# Patient Record
Sex: Female | Born: 1940 | Race: White | Hispanic: No | Smoking: Former smoker
Health system: Southern US, Community
[De-identification: ages and names within clinical notes are randomized; demographics above are authoritative.]

## PROBLEM LIST (undated history)

## (undated) DIAGNOSIS — Z9109 Other allergy status, other than to drugs and biological substances: Secondary | ICD-10-CM

## (undated) DIAGNOSIS — M1990 Unspecified osteoarthritis, unspecified site: Secondary | ICD-10-CM

## (undated) DIAGNOSIS — F329 Major depressive disorder, single episode, unspecified: Secondary | ICD-10-CM

## (undated) DIAGNOSIS — Z860100 Personal history of colon polyps, unspecified: Secondary | ICD-10-CM

## (undated) DIAGNOSIS — K449 Diaphragmatic hernia without obstruction or gangrene: Secondary | ICD-10-CM

## (undated) DIAGNOSIS — E039 Hypothyroidism, unspecified: Secondary | ICD-10-CM

## (undated) DIAGNOSIS — Z8711 Personal history of peptic ulcer disease: Secondary | ICD-10-CM

## (undated) DIAGNOSIS — R251 Tremor, unspecified: Secondary | ICD-10-CM

## (undated) DIAGNOSIS — M797 Fibromyalgia: Secondary | ICD-10-CM

## (undated) DIAGNOSIS — Z8719 Personal history of other diseases of the digestive system: Secondary | ICD-10-CM

## (undated) DIAGNOSIS — K219 Gastro-esophageal reflux disease without esophagitis: Secondary | ICD-10-CM

## (undated) DIAGNOSIS — F32A Depression, unspecified: Secondary | ICD-10-CM

## (undated) DIAGNOSIS — I10 Essential (primary) hypertension: Secondary | ICD-10-CM

## (undated) DIAGNOSIS — Z8619 Personal history of other infectious and parasitic diseases: Secondary | ICD-10-CM

## (undated) DIAGNOSIS — E785 Hyperlipidemia, unspecified: Secondary | ICD-10-CM

## (undated) HISTORY — PX: CHOLECYSTECTOMY: SHX55

## (undated) HISTORY — PX: TONSILLECTOMY: SUR1361

## (undated) HISTORY — PX: ABDOMINAL HYSTERECTOMY: SHX81

## (undated) HISTORY — PX: LUMBAR SPINE SURGERY: SHX701

## (undated) HISTORY — DX: Essential (primary) hypertension: I10

## (undated) HISTORY — DX: Personal history of other infectious and parasitic diseases: Z86.19

## (undated) HISTORY — DX: Hyperlipidemia, unspecified: E78.5

## (undated) HISTORY — DX: Unspecified osteoarthritis, unspecified site: M19.90

## (undated) HISTORY — DX: Fibromyalgia: M79.7

## (undated) HISTORY — DX: Personal history of colonic polyps: Z86.010

## (undated) HISTORY — DX: Gastro-esophageal reflux disease without esophagitis: K21.9

## (undated) HISTORY — DX: Personal history of colon polyps, unspecified: Z86.0100

## (undated) HISTORY — PX: INCONTINENCE SURGERY: SHX676

## (undated) HISTORY — DX: Diaphragmatic hernia without obstruction or gangrene: K44.9

## (undated) HISTORY — DX: Tremor, unspecified: R25.1

## (undated) HISTORY — DX: Personal history of other diseases of the digestive system: Z87.19

## (undated) HISTORY — DX: Depression, unspecified: F32.A

## (undated) HISTORY — PX: UPPER GASTROINTESTINAL ENDOSCOPY: SHX188

## (undated) HISTORY — PX: COLONOSCOPY: SHX174

## (undated) HISTORY — DX: Personal history of peptic ulcer disease: Z87.11

## (undated) HISTORY — DX: Other allergy status, other than to drugs and biological substances: Z91.09

## (undated) HISTORY — DX: Hypothyroidism, unspecified: E03.9

## (undated) HISTORY — DX: Major depressive disorder, single episode, unspecified: F32.9

## (undated) HISTORY — PX: CATARACT EXTRACTION: SUR2

---

## 2009-05-28 LAB — HM COLONOSCOPY

## 2013-04-03 ENCOUNTER — Encounter: Payer: Self-pay | Admitting: Family Medicine

## 2013-09-03 LAB — HM DEXA SCAN: HM DEXA SCAN: NORMAL

## 2014-10-02 LAB — HM MAMMOGRAPHY: HM MAMMO: NORMAL (ref 0–4)

## 2016-01-25 ENCOUNTER — Ambulatory Visit: Payer: Self-pay | Admitting: Family Medicine

## 2016-01-26 ENCOUNTER — Encounter: Payer: Self-pay | Admitting: Family Medicine

## 2016-01-26 ENCOUNTER — Ambulatory Visit (INDEPENDENT_AMBULATORY_CARE_PROVIDER_SITE_OTHER): Payer: Medicare Other | Admitting: Family Medicine

## 2016-01-26 VITALS — BP 120/60 | HR 78 | Temp 98.0°F | Ht 64.5 in | Wt 127.0 lb

## 2016-01-26 DIAGNOSIS — M797 Fibromyalgia: Secondary | ICD-10-CM | POA: Diagnosis not present

## 2016-01-26 DIAGNOSIS — E039 Hypothyroidism, unspecified: Secondary | ICD-10-CM

## 2016-01-26 DIAGNOSIS — M1990 Unspecified osteoarthritis, unspecified site: Secondary | ICD-10-CM

## 2016-01-26 DIAGNOSIS — R251 Tremor, unspecified: Secondary | ICD-10-CM | POA: Diagnosis not present

## 2016-01-26 DIAGNOSIS — I10 Essential (primary) hypertension: Secondary | ICD-10-CM | POA: Diagnosis not present

## 2016-01-26 DIAGNOSIS — Z9109 Other allergy status, other than to drugs and biological substances: Secondary | ICD-10-CM

## 2016-01-26 DIAGNOSIS — Z86010 Personal history of colonic polyps: Secondary | ICD-10-CM

## 2016-01-26 DIAGNOSIS — K449 Diaphragmatic hernia without obstruction or gangrene: Secondary | ICD-10-CM

## 2016-01-26 DIAGNOSIS — Z8719 Personal history of other diseases of the digestive system: Secondary | ICD-10-CM

## 2016-01-26 DIAGNOSIS — E785 Hyperlipidemia, unspecified: Secondary | ICD-10-CM

## 2016-01-26 DIAGNOSIS — Z8711 Personal history of peptic ulcer disease: Secondary | ICD-10-CM

## 2016-01-26 DIAGNOSIS — Z8619 Personal history of other infectious and parasitic diseases: Secondary | ICD-10-CM

## 2016-01-26 DIAGNOSIS — F329 Major depressive disorder, single episode, unspecified: Secondary | ICD-10-CM

## 2016-01-26 DIAGNOSIS — F32A Depression, unspecified: Secondary | ICD-10-CM

## 2016-01-26 NOTE — Patient Instructions (Signed)
Take care.  Glad to see you.  We'll work on getting your records and then we'll set up some labs.  We'll go from there.  Update me as needed.

## 2016-01-26 NOTE — Progress Notes (Signed)
Pre visit review using our clinic review tool, if applicable. No additional management support is needed unless otherwise documented below in the visit note. 

## 2016-01-28 ENCOUNTER — Encounter: Payer: Self-pay | Admitting: Family Medicine

## 2016-01-28 DIAGNOSIS — Z86010 Personal history of colonic polyps: Secondary | ICD-10-CM | POA: Insufficient documentation

## 2016-01-28 DIAGNOSIS — Z8619 Personal history of other infectious and parasitic diseases: Secondary | ICD-10-CM | POA: Insufficient documentation

## 2016-01-28 DIAGNOSIS — M1990 Unspecified osteoarthritis, unspecified site: Secondary | ICD-10-CM | POA: Insufficient documentation

## 2016-01-28 DIAGNOSIS — E039 Hypothyroidism, unspecified: Secondary | ICD-10-CM | POA: Insufficient documentation

## 2016-01-28 DIAGNOSIS — Z8711 Personal history of peptic ulcer disease: Secondary | ICD-10-CM | POA: Insufficient documentation

## 2016-01-28 DIAGNOSIS — R251 Tremor, unspecified: Secondary | ICD-10-CM | POA: Insufficient documentation

## 2016-01-28 DIAGNOSIS — E785 Hyperlipidemia, unspecified: Secondary | ICD-10-CM | POA: Insufficient documentation

## 2016-01-28 DIAGNOSIS — I10 Essential (primary) hypertension: Secondary | ICD-10-CM | POA: Insufficient documentation

## 2016-01-28 DIAGNOSIS — F32A Depression, unspecified: Secondary | ICD-10-CM | POA: Insufficient documentation

## 2016-01-28 DIAGNOSIS — Z9109 Other allergy status, other than to drugs and biological substances: Secondary | ICD-10-CM | POA: Insufficient documentation

## 2016-01-28 DIAGNOSIS — Z8719 Personal history of other diseases of the digestive system: Secondary | ICD-10-CM | POA: Insufficient documentation

## 2016-01-28 DIAGNOSIS — F329 Major depressive disorder, single episode, unspecified: Secondary | ICD-10-CM | POA: Insufficient documentation

## 2016-01-28 DIAGNOSIS — K449 Diaphragmatic hernia without obstruction or gangrene: Secondary | ICD-10-CM | POA: Insufficient documentation

## 2016-01-28 DIAGNOSIS — M797 Fibromyalgia: Secondary | ICD-10-CM | POA: Insufficient documentation

## 2016-01-28 NOTE — Assessment & Plan Note (Signed)
No change in meds.  No ADE on med.  Continue as is.  Requesting records.  >30 minutes spent in face to face time with patient, >50% spent in counselling or coordination of care.

## 2016-01-28 NOTE — Progress Notes (Signed)
New patient.  Fibromyalgia with diffuse aches.  Going on for years . Requesting records.  No ADE on meds.   HTN.  No ADE on med.  No CP, not SOB.  Compliant.  Will be due for labs.  Requesting records.   Hypothyroidism.  No ADE on med. Compliant.  Will be due for labs.  Requesting records.   Tremor.  Longstanding.  Prev neuro eval, thought not to have parkinsons per patient report.  Requesting records.   Requesting records re: prev vaccinations and health maintenance. Vaccines up to date per patient report.   PMH and SH reviewed  ROS: Per HPI unless specifically indicated in ROS section   Meds, vitals, and allergies reviewed.   GEN: nad, alert and oriented HEENT: mucous membranes moist NECK: supple w/o LA, no tmg CV: rrr.  PULM: ctab, no inc wob ABD: soft, +bs EXT: no edema SKIN: no acute rash, tremor noted in B hands.

## 2016-01-28 NOTE — Assessment & Plan Note (Signed)
Requesting records.  At baseline per patient.

## 2016-01-28 NOTE — Assessment & Plan Note (Signed)
No change in meds.  No ADE on med.  Continue as is.  Requesting records.  

## 2016-01-28 NOTE — Assessment & Plan Note (Signed)
No change in meds.  No ADE on med.  Continue as is.  Requesting records.

## 2016-03-04 ENCOUNTER — Telehealth: Payer: Self-pay | Admitting: Family Medicine

## 2016-03-04 NOTE — Telephone Encounter (Signed)
Did Dr.Duncan receive records from Dr.McClelland? Please call patient back to let her know.

## 2016-03-06 ENCOUNTER — Encounter: Payer: Self-pay | Admitting: Family Medicine

## 2016-03-06 NOTE — Telephone Encounter (Signed)
I haven't gotten anything yet.  I held onto the record request form after it was faxed previously, as I was awaiting records.  Please send again.  I was going to ask about this if we didn't get records soon.    Fax number is [phone].   Thanks.

## 2016-03-07 NOTE — Telephone Encounter (Signed)
Please send form to Ricki MillerLinda Thomas to see if the front office can work on getingt these records.

## 2016-03-07 NOTE — Telephone Encounter (Signed)
It's on your desk.  Thanks.

## 2016-03-07 NOTE — Telephone Encounter (Signed)
Form up front as Ricki MillerLinda Thomas instructed.

## 2016-03-17 ENCOUNTER — Ambulatory Visit (INDEPENDENT_AMBULATORY_CARE_PROVIDER_SITE_OTHER): Payer: Medicare Other

## 2016-03-17 ENCOUNTER — Other Ambulatory Visit: Payer: Self-pay | Admitting: Family Medicine

## 2016-03-17 ENCOUNTER — Encounter (INDEPENDENT_AMBULATORY_CARE_PROVIDER_SITE_OTHER): Payer: Self-pay

## 2016-03-17 VITALS — BP 120/80 | HR 67 | Temp 97.5°F | Ht 63.5 in | Wt 131.0 lb

## 2016-03-17 DIAGNOSIS — I10 Essential (primary) hypertension: Secondary | ICD-10-CM

## 2016-03-17 DIAGNOSIS — Z0000 Encounter for general adult medical examination without abnormal findings: Secondary | ICD-10-CM

## 2016-03-17 LAB — COMPREHENSIVE METABOLIC PANEL
ALBUMIN: 4.4 g/dL (ref 3.5–5.2)
ALK PHOS: 66 U/L (ref 39–117)
ALT: 22 U/L (ref 0–35)
AST: 15 U/L (ref 0–37)
BUN: 13 mg/dL (ref 6–23)
CALCIUM: 9.5 mg/dL (ref 8.4–10.5)
CHLORIDE: 102 meq/L (ref 96–112)
CO2: 30 mEq/L (ref 19–32)
Creatinine, Ser: 0.84 mg/dL (ref 0.40–1.20)
GFR: 69.83 mL/min (ref >60.00)
Glucose, Bld: 99 mg/dL (ref 70–99)
POTASSIUM: 4 meq/L (ref 3.5–5.1)
SODIUM: 137 meq/L (ref 135–145)
TOTAL PROTEIN: 7.2 g/dL (ref 6.0–8.3)
Total Bilirubin: 0.8 mg/dL (ref 0.2–1.2)

## 2016-03-17 LAB — CBC WITH DIFFERENTIAL/PLATELET
Basophils Absolute: 0 10*3/uL (ref 0.0–0.1)
Basophils Relative: 0.8 % (ref 0.0–3.0)
EOS ABS: 0.1 10*3/uL (ref 0.0–0.7)
EOS PCT: 2.5 % (ref 0.0–5.0)
HEMATOCRIT: 37 % (ref 36.0–46.0)
HEMOGLOBIN: 12.5 g/dL (ref 12.0–15.0)
LYMPHS PCT: 28.3 % (ref 12.0–46.0)
Lymphs Abs: 1.6 10*3/uL (ref 0.7–4.0)
MCHC: 33.7 g/dL (ref 30.0–36.0)
MCV: 89.9 fl (ref 78.0–100.0)
MONO ABS: 0.3 10*3/uL (ref 0.1–1.0)
Monocytes Relative: 5.7 % (ref 3.0–12.0)
Neutro Abs: 3.6 10*3/uL (ref 1.4–7.7)
Neutrophils Relative %: 62.7 % (ref 43.0–77.0)
Platelets: 238 10*3/uL (ref 150.0–400.0)
RBC: 4.11 Mil/uL (ref 3.87–5.11)
RDW: 12.6 % (ref 11.5–15.5)
WBC: 5.7 10*3/uL (ref 4.0–10.5)

## 2016-03-17 LAB — LIPID PANEL
CHOLESTEROL: 218 mg/dL — AB (ref 0–200)
HDL: 59.4 mg/dL (ref >39.00)
LDL CALC: 138 mg/dL — AB (ref 0–99)
NonHDL: 158.34
TRIGLYCERIDES: 101 mg/dL (ref 0.0–149.0)
Total CHOL/HDL Ratio: 4
VLDL: 20.2 mg/dL (ref 0.0–40.0)

## 2016-03-17 LAB — TSH: TSH: 0.93 u[IU]/mL (ref 0.35–4.50)

## 2016-03-17 NOTE — Patient Instructions (Signed)
[name] , Thank you for taking time to come for your Medicare Wellness Visit. I appreciate your ongoing commitment to your health goals. Please review the following plan we discussed and let me know if I can assist you in the future.   These are the goals we discussed: Goals    . safety          Starting 03/17/16, I will continue to use cane as needed to prevent accidental falls.        This is a list of the screening recommended for you and due dates:  Health Maintenance  Topic Date Due  . Shingles Vaccine  03/17/2026*  . Tetanus Vaccine  03/17/2026*  . Pneumonia vaccines (2 of 2 - PPSV23) 11/14/2016  . Flu Shot  Addressed  . DEXA scan (bone density measurement)  Completed  *Topic was postponed. The date shown is not the original due date.   Preventive Care for Adults  A healthy lifestyle and preventive care can promote health and wellness. Preventive health guidelines for adults include the following key practices.  . A routine yearly physical is a good way to check with your health care provider about your health and preventive screening. It is a chance to share any concerns and updates on your health and to receive a thorough exam.  . Visit your dentist for a routine exam and preventive care every 6 months. Brush your teeth twice a day and floss once a day. Good oral hygiene prevents tooth decay and gum disease.  . The frequency of eye exams is based on your age, health, family medical history, use  of contact lenses, and other factors. Follow your health care provider's ecommendations for frequency of eye exams.  . Eat a healthy diet. Foods like vegetables, fruits, whole grains, low-fat dairy products, and lean protein foods contain the nutrients you need without too many calories. Decrease your intake of foods high in solid fats, added sugars, and salt. Eat the right amount of calories for you. Get information about a proper diet from your health care provider, if  necessary.  . Regular physical exercise is one of the most important things you can do for your health. Most adults should get at least 150 minutes of moderate-intensity exercise (any activity that increases your heart rate and causes you to sweat) each week. In addition, most adults need muscle-strengthening exercises on 2 or more days a week.  Silver Sneakers may be a benefit available to you. To determine eligibility, you may visit the website: www.silversneakers.com or contact program at [phone] Mon-Fri between 8AM-8PM.   . Maintain a healthy weight. The body mass index (BMI) is a screening tool to identify possible weight problems. It provides an estimate of body fat based on height and weight. Your health care provider can find your BMI and can help you achieve or maintain a healthy weight.   For adults 20 years and older: ? A BMI below 18.5 is considered underweight. ? A BMI of 18.5 to 24.9 is normal. ? A BMI of 25 to 29.9 is considered overweight. ? A BMI of 30 and above is considered obese.   . Maintain normal blood lipids and cholesterol levels by exercising and minimizing your intake of saturated fat. Eat a balanced diet with plenty of fruit and vegetables. Blood tests for lipids and cholesterol should begin at age 78 and be repeated every 5 years. If your lipid or cholesterol levels are high, you are over 50, or you  are at high risk for heart disease, you may need your cholesterol levels checked more frequently. Ongoing high lipid and cholesterol levels should be treated with medicines if diet and exercise are not working.  . If you smoke, find out from your health care provider how to quit. If you do not use tobacco, please do not start.  . If you choose to drink alcohol, please do not consume more than 2 drinks per day. One drink is considered to be 12 ounces (355 mL) of beer, 5 ounces (148 mL) of wine, or 1.5 ounces (44 mL) of liquor.  . If you are 66-51 years old, ask your  health care provider if you should take aspirin to prevent strokes.  . Use sunscreen. Apply sunscreen liberally and repeatedly throughout the day. You should seek shade when your shadow is shorter than you. Protect yourself by wearing long sleeves, pants, a wide-brimmed hat, and sunglasses year round, whenever you are outdoors.  . Once a month, do a whole body skin exam, using a mirror to look at the skin on your back. Tell your health care provider of new moles, moles that have irregular borders, moles that are larger than a pencil eraser, or moles that have changed in shape or color.

## 2016-03-17 NOTE — Progress Notes (Signed)
Pre visit review using our clinic review tool, if applicable. No additional management support is needed unless otherwise documented below in the visit note. 

## 2016-03-17 NOTE — Progress Notes (Signed)
Subjective:   Kelsey Christian is a 76 y.o. female who presents for an Initial Medicare Annual Wellness Visit.  Review of Systems    N/A  Cardiac Risk Factors include: advanced age (>71men, >56 women);dyslipidemia;hypertension     Objective:    Today's Vitals   03/17/16 1133  BP: 120/80  Pulse: 67  Temp: 97.5 F (36.4 C)  TempSrc: Oral  SpO2: 98%  Weight: 131 lb (59.4 kg)  Height: 5' 3.5" (1.613 m)  PainSc: 5   PainLoc: Generalized   Body mass index is 22.84 kg/m.   Current Medications (verified) Outpatient Encounter Prescriptions as of 03/17/2016  Medication Sig  . carboxymethylcellulose 1 % ophthalmic solution 1 drop 3 (three) times daily.  . diphenhydrAMINE (BENADRYL) 25 MG tablet Take 25 mg by mouth every 6 (six) hours as needed.  . fluticasone (FLONASE) 50 MCG/ACT nasal spray Place 2 sprays into both nostrils daily.  . hypromellose (GENTEAL SEVERE) 0.3 % GEL ophthalmic ointment Place into both eyes once.  Marland Kitchen levothyroxine (SYNTHROID, LEVOTHROID) 112 MCG tablet Take 112 mcg by mouth daily before breakfast.  . loratadine (CLARITIN) 10 MG tablet Take 10 mg by mouth daily.  Marland Kitchen LORazepam (ATIVAN) 0.5 MG tablet Take 0.5 mg by mouth every 8 (eight) hours.  Marland Kitchen losartan (COZAAR) 100 MG tablet Take 100 mg by mouth daily.  Marland Kitchen omeprazole (PRILOSEC) 20 MG capsule Take 20 mg by mouth daily.  . polyvinyl alcohol-povidone (HYPOTEARS) 1.4-0.6 % ophthalmic solution Place 1-2 drops into both eyes as needed.  . Psyllium (RA FIBER SUPPLEMENT PO) Take by mouth daily.  . traMADol (ULTRAM) 50 MG tablet Take by mouth 3 (three) times daily as needed.  . traZODone (DESYREL) 50 MG tablet Take 50 mg by mouth at bedtime.  . verapamil (CALAN-SR) 180 MG CR tablet Take 180 mg by mouth 2 (two) times daily.   No facility-administered encounter medications on file as of 03/17/2016.     Allergies (verified) Penicillins   History: Past Medical History:  Diagnosis Date  . Arthritis   . Depression     . Environmental allergies   . Fibromyalgia   . GERD (gastroesophageal reflux disease)   . Hiatal hernia   . History of brucellosis   . History of colon polyps   . History of stomach ulcers   . HLD (hyperlipidemia)   . HTN (hypertension)   . Hypothyroidism   . Tremor    Past Surgical History:  Procedure Laterality Date  . ABDOMINAL HYSTERECTOMY    . CATARACT EXTRACTION Bilateral   . CHOLECYSTECTOMY    . INCONTINENCE SURGERY    . LUMBAR SPINE SURGERY    . TONSILLECTOMY     Family History  Problem Relation Age of Onset  . Stroke Mother   . Heart disease Father    Social History   Occupational History  . Not on file.   Social History Main Topics  . Smoking status: Former Games developer  . Smokeless tobacco: Never Used  . Alcohol use No  . Drug use: No  . Sexual activity: Not on file    Tobacco Counseling Counseling given: No   Activities of Daily Living In your present state of health, do you have any difficulty performing the following activities: 03/17/2016  Hearing? N  Vision? N  Difficulty concentrating or making decisions? Y  Walking or climbing stairs? Y  Dressing or bathing? N  Doing errands, shopping? Y  Preparing Food and eating ? Y  Using the Toilet? N  In  the past six months, have you accidently leaked urine? Y  Do you have problems with loss of bowel control? N  Managing your Medications? N  Managing your Finances? N  Housekeeping or managing your Housekeeping? Y  Some recent data might be hidden    Immunizations and Health Maintenance Immunization History  Administered Date(s) Administered  . Influenza-Unspecified 11/15/2015  . Pneumococcal Conjugate-13 11/15/2015   There are no preventive care reminders to display for this patient.  Patient Care Team: Joaquim NamGraham S Duncan, MD as PCP - General (Family Medicine)     Assessment:   This is a routine wellness examination for Kelsey Christian.   Hearing/Vision screen  Hearing Screening   125Hz  250Hz  500Hz   1000Hz  2000Hz  3000Hz  4000Hz  6000Hz  8000Hz   Right ear:   40 0 40  40    Left ear:   0 0 40  40      Visual Acuity Screening   Right eye Left eye Both eyes  Without correction:     With correction: 20/20 20/20 20/20     Dietary issues and exercise activities discussed: Current Exercise Habits: The patient does not participate in regular exercise at present, Exercise limited by: neurologic condition(s);orthopedic condition(s)  Goals    . safety          Starting 03/17/16, I will continue to use cane as needed to prevent accidental falls.       Depression Screen PHQ 2/9 Scores 03/17/2016  PHQ - 2 Score 0    Fall Risk Fall Risk  03/17/2016  Falls in the past year? No    Cognitive Function: MMSE - Mini Mental State Exam 03/17/2016  Orientation to time 5  Orientation to Place 5  Registration 3  Attention/ Calculation 0  Recall 1  Recall-comments pt was unable to recall 2 of 3 words  Language- name 2 objects 0  Language- repeat 1  Language- follow 3 step command 3  Language- read & follow direction 0  Write a sentence 0  Copy design 0  Total score 18     PLEASE NOTE: A Mini-Cog screen was completed. Maximum score is 20. A value of 0 denotes this part of Folstein MMSE was not completed or the patient failed this part of the Mini-Cog screening.   Mini-Cog Screening Orientation to Time - Max 5 pts Orientation to Place - Max 5 pts Registration - Max 3 pts Recall - Max 3 pts Language Repeat - Max 1 pts Language Follow 3 Step Command - Max 3 pts    Screening Tests Health Maintenance  Topic Date Due  . ZOSTAVAX  03/17/2026 (Originally 12/18/1998)  . TETANUS/TDAP  03/17/2026 (Originally 12/17/1959)  . PNA vac Low Risk Adult (2 of 2 - PPSV23) 11/14/2016  . INFLUENZA VACCINE  Addressed  . DEXA SCAN  Completed      Plan:     I have personally reviewed and addressed the Medicare Annual Wellness questionnaire and have noted the following in the patient's chart:  A. Medical and  social history B. Use of alcohol, tobacco or illicit drugs  C. Current medications and supplements D. Functional ability and status E.  Nutritional status F.  Physical activity G. Advance directives H. List of other physicians I.  Hospitalizations, surgeries, and ER visits in previous 12 months J.  Vitals K. Screenings to include hearing, vision, cognitive, depression L. Referrals and appointments - none  In addition, I have reviewed and discussed with patient certain preventive protocols, quality metrics, and best practice recommendations.  A written personalized care plan for preventive services as well as general preventive health recommendations were provided to patient.  See attached scanned questionnaire for additional information.   Signed,   Lindell Noe, MHA, BS, LPN Health Coach

## 2016-03-17 NOTE — Progress Notes (Signed)
PCP notes:   Health maintenance:  Flu vaccine - per pt, administered in Oct 2017 PCV13 - per pt, administered in Oct 2017 Tetanus - postponed/insurance Shingles vaccine - postponed/insurance  Abnormal screenings:   Hearing - failed Mini-Cog score: 18/20  Patient concerns:   None  Nurse concerns:  None  Next PCP appt:   03/21/16 @ 1415  I reviewed health advisor's note, was available for consultation on the day of service listed in this note, and agree with documentation and plan. Crawford GivensGraham Duncan, MD.

## 2016-03-21 ENCOUNTER — Encounter: Payer: Self-pay | Admitting: Family Medicine

## 2016-03-21 ENCOUNTER — Ambulatory Visit (INDEPENDENT_AMBULATORY_CARE_PROVIDER_SITE_OTHER): Payer: Medicare Other | Admitting: Family Medicine

## 2016-03-21 VITALS — BP 130/70 | HR 75 | Temp 97.4°F | Ht 63.5 in | Wt 126.5 lb

## 2016-03-21 DIAGNOSIS — Z0000 Encounter for general adult medical examination without abnormal findings: Secondary | ICD-10-CM

## 2016-03-21 DIAGNOSIS — I10 Essential (primary) hypertension: Secondary | ICD-10-CM

## 2016-03-21 DIAGNOSIS — R251 Tremor, unspecified: Secondary | ICD-10-CM | POA: Diagnosis not present

## 2016-03-21 DIAGNOSIS — M797 Fibromyalgia: Secondary | ICD-10-CM | POA: Diagnosis not present

## 2016-03-21 DIAGNOSIS — E785 Hyperlipidemia, unspecified: Secondary | ICD-10-CM

## 2016-03-21 DIAGNOSIS — Z86010 Personal history of colonic polyps: Secondary | ICD-10-CM

## 2016-03-21 DIAGNOSIS — R5383 Other fatigue: Secondary | ICD-10-CM

## 2016-03-21 DIAGNOSIS — Z8719 Personal history of other diseases of the digestive system: Secondary | ICD-10-CM | POA: Diagnosis not present

## 2016-03-21 DIAGNOSIS — R413 Other amnesia: Secondary | ICD-10-CM

## 2016-03-21 MED ORDER — DHEA 25 MG PO CAPS
25.0000 mg | ORAL_CAPSULE | Freq: Every day | ORAL | 1 refills | Status: AC
Start: 2016-03-21 — End: ?

## 2016-03-21 NOTE — Progress Notes (Signed)
Flu vaccine - per pt, administered in Oct 2017 PCV13 - per pt, administered in Oct 2017 Tetanus - postponed/insurance, d/w pt.  Shingles vaccine - postponed/insurance, d/w pt.   Abnormal screenings:  Hearing - failed.  Declined hearing aids.   Mini-Cog score: 18/20.   She notes some lapses in memory.   Rechecked today: She knows the year, month, day of the week.  Knows address.   Can do math.   Can read a watch.   2/3 recall today.   She isn't driving, but that was a long standing decision not memory related.   She came in today with a copy of her records recalling details of prev events.   We discussed that she may have some memory lapses, but did at this point does not appear to have an ominous loss of memory that puts her in a high-risk situation. We agreed to follow this clinically.  Fibromyalgia. Had avoided statins since she already had aches.  She is managing with daily pain in the meantime.  Intolerant of gabapentin with inc falls on med.    H/o Barrett's.  Discussed with patient about referral to GI for the consideration of EGD and cologuard versus colonoscopy. I would like GI input. If she could avoid a bowel prep for the colonoscopy and proceed with: Instead, this would be preferred by the patient.  In any event she needs referral to GI because of her history of Barrett's.  Fatigue. Previously treated with DHEA.  Has been on DHEA for years, 22.5mg  a day.  Discussed with patient about options. I did not want her to stop the medication cold [place]. I wrote a prescription for 25 mg. She can take that 6 days a week to get approximately the same dose and we can recheck a level later on. She had significant return of fatigue when she has stopped the medication previously.  Hypertension:    Using medication without problems or lightheadedness: yes Chest pain with exertion:no Edema:no Short of breath:no Labs d/w pt.   Tremor.  D/w pt.  Worse in the last few months.  D/w pt about  neuro referral.      Urinary frequency.  Not burning with urination.  D/w pt about Kegel's.    PMH and SH reviewed  ROS: Per HPI unless specifically indicated in ROS section   Meds, vitals, and allergies reviewed.   GEN: nad, alert and oriented HEENT: mucous membranes moist NECK: supple w/o LA CV: rrr.  no murmur PULM: ctab, no inc wob ABD: soft, +bs EXT: no edema SKIN: no acute rash B hand tremor noted.

## 2016-03-21 NOTE — Progress Notes (Signed)
Pre visit review using our clinic review tool, if applicable. No additional management support is needed unless otherwise documented below in the visit note. 

## 2016-03-21 NOTE — Patient Instructions (Addendum)
Kelsey Christian will call about your referral ([facility] and [place]  Recheck DHEA in about 3 months.  Take care.  Glad to see you.  Update me as needed.

## 2016-03-23 DIAGNOSIS — Z0000 Encounter for general adult medical examination without abnormal findings: Secondary | ICD-10-CM | POA: Insufficient documentation

## 2016-03-23 DIAGNOSIS — R413 Other amnesia: Secondary | ICD-10-CM | POA: Insufficient documentation

## 2016-03-23 DIAGNOSIS — R5383 Other fatigue: Secondary | ICD-10-CM | POA: Insufficient documentation

## 2016-03-23 NOTE — Assessment & Plan Note (Signed)
Refer to neurology

## 2016-03-23 NOTE — Assessment & Plan Note (Signed)
Blood pressure is reasonable. Continue as is. Labs discussed with patient.

## 2016-03-23 NOTE — Assessment & Plan Note (Signed)
See above, refer to GI for the consideration of EGD and cologuard versus colonoscopy. I would like GI input. If she could avoid a bowel prep for the colonoscopy and proceed with: Instead, this would be preferred by the patient.  In any event she needs referral to GI because of her history of Barrett's.

## 2016-03-23 NOTE — Assessment & Plan Note (Signed)
She had chronically been taking DHEA. She likely will not be able to get her previous dose of 22.5 mg capsules. I wrote a prescription for 25 mg capsules. She can take those 6 days a week. We can recheck a level later on the spring. We can go from there. She previously had profound fatigue returned when she had stopped the medication.

## 2016-03-23 NOTE — Assessment & Plan Note (Signed)
We tabled the issue of statin treatment at this point.

## 2016-03-23 NOTE — Assessment & Plan Note (Signed)
Flu vaccine - per pt, administered in Oct 2017 PCV13 - per pt, administered in Oct 2017 Tetanus - postponed/insurance, d/w pt.  Shingles vaccine - postponed/insurance, d/w pt.  Hearing - failed.  Declined hearing aids.

## 2016-03-23 NOTE — Assessment & Plan Note (Signed)
Continue current medications. Pain is manageable as is.

## 2016-03-23 NOTE — Assessment & Plan Note (Addendum)
Prev Mini-Cog score: 18/20.   She notes some lapses in memory.   Rechecked today: She knows the year, month, day of the week.  Knows address.   Can do math.   Can read a watch.   2/3 recall today.   She isn't driving, but that was a long standing decision not memory related.   She came in today with a copy of her records recalling details of prev events.   We discussed that she may have some memory lapses, but did at this point does not appear to have an ominous loss of memory that puts her in a high-risk situation. We agreed to follow this clinically.  >25 minutes spent in face to face time with patient, >50% spent in counselling or coordination of care.

## 2016-04-13 NOTE — Progress Notes (Signed)
Subjective:   Kelsey Christian was seen in consultation in the movement disorder clinic at the request of Crawford Givens, MD.  The evaluation is for tremor.  I asked her several times how long tremor had been going on and she never answered the question and kept going back to her pain and fibromyalgia. Tremor involves the hands and arms and "my walk isn't that good."  Tremor is most noticeable "before I take my medication because my lorazepam helps it."  She takes 1.5 tablets (0.5mg ) bid of ativan and "I should be taking two of them."   There is a family hx of tremor in her brother but "not like me."    Affected by caffeine:  No. (1-2 cups of coffee) Affected by alcohol:  Doesn't drink EtOH Affected by stress:  Yes.   Affected by fatigue:  Yes.   Spills soup if on spoon:  Yes.   Spills glass of liquid if full:  Yes.   Affects ADL's (tying shoes, brushing teeth, etc):  Yes.    Current/Previously tried tremor medications: no  Current medications that may exacerbate tremor:  no  Outside reports reviewed: historical medical records, office notes and referral letter/letters.  Allergies  Allergen Reactions  . Gabapentin Other (See Comments)    Gait instability, falls.    . Penicillins Swelling    Outpatient Encounter Prescriptions as of 04/15/2016  Medication Sig  . carboxymethylcellulose 1 % ophthalmic solution 1 drop 3 (three) times daily.  Marland Kitchen DHEA 25 MG CAPS Take 1 capsule (25 mg total) by mouth daily. Except on Sundays.  (take 6 days a week)  . diphenhydrAMINE (BENADRYL) 25 MG tablet Take 25 mg by mouth every 6 (six) hours as needed.  . fluticasone (FLONASE) 50 MCG/ACT nasal spray Place 2 sprays into both nostrils daily.  . hydrochlorothiazide (MICROZIDE) 12.5 MG capsule Take 12.5 mg by mouth daily.  . hypromellose (GENTEAL SEVERE) 0.3 % GEL ophthalmic ointment Place into both eyes once.  Marland Kitchen levothyroxine (SYNTHROID, LEVOTHROID) 112 MCG tablet Take 112 mcg by mouth daily before breakfast.   . loratadine (CLARITIN) 10 MG tablet Take 10 mg by mouth daily.  Marland Kitchen LORazepam (ATIVAN) 0.5 MG tablet Take 0.5 mg by mouth every 8 (eight) hours.  Marland Kitchen losartan (COZAAR) 100 MG tablet Take 100 mg by mouth daily.  Marland Kitchen omeprazole (PRILOSEC) 20 MG capsule Take 20 mg by mouth daily.  . polyvinyl alcohol-povidone (HYPOTEARS) 1.4-0.6 % ophthalmic solution Place 1-2 drops into both eyes as needed.  . Psyllium (RA FIBER SUPPLEMENT PO) Take by mouth daily.  . traMADol (ULTRAM) 50 MG tablet Take by mouth 3 (three) times daily as needed.  . traZODone (DESYREL) 50 MG tablet Take 50 mg by mouth at bedtime.  . verapamil (CALAN-SR) 180 MG CR tablet Take 180 mg by mouth 2 (two) times daily.  . [DISCONTINUED] irbesartan (AVAPRO) 150 MG tablet Take 150 mg by mouth daily.   No facility-administered encounter medications on file as of 04/15/2016.     Past Medical History:  Diagnosis Date  . Arthritis   . Depression   . Environmental allergies   . Fibromyalgia   . GERD (gastroesophageal reflux disease)   . Hiatal hernia   . History of brucellosis   . History of colon polyps   . History of stomach ulcers   . HLD (hyperlipidemia)   . HTN (hypertension)   . Hypothyroidism   . Tremor     Past Surgical History:  Procedure Laterality Date  .  ABDOMINAL HYSTERECTOMY    . CATARACT EXTRACTION Bilateral   . CHOLECYSTECTOMY    . INCONTINENCE SURGERY    . LUMBAR SPINE SURGERY    . TONSILLECTOMY      Social History   Social History  . Marital status: Widowed    Spouse name: N/A  . Number of children: N/A  . Years of education: N/A   Occupational History  . Not on file.   Social History Main Topics  . Smoking status: Former Smoker    Quit date: 04/15/1981  . Smokeless tobacco: Never Used  . Alcohol use No  . Drug use: No  . Sexual activity: Not on file   Other Topics Concern  . 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 Mother Deceased  . Father Deceased  . Sister Alive  . Child Alive  . Child Deceased    Review of Systems A complete 10 system ROS was obtained and was negative apart from what is mentioned.   Objective:   VITALS:   Vitals:   04/15/16 1252  BP: 122/76  Pulse: 76  Weight: 131 lb (59.4 kg)  Height: 5' 3.5" (1.613 m)   Gen:  Appears stated age and in NAD.  Has trouble staying focused HEENT:  Normocephalic, atraumatic. The mucous membranes are moist. The superficial temporal arteries are without ropiness or tenderness. Cardiovascular: Regular rate and rhythm. Lungs: Clear to auscultation bilaterally. Neck: There are no carotid bruits noted bilaterally.  NEUROLOGICAL:  Orientation:  The patient is alert and oriented x 3.  Recent and remote memory are intact.  Attention span and concentration are normal.  Able to name objects and repeat without trouble.  Fund of knowledge is appropriate Cranial nerves: There is good facial symmetry. The pupils are equal round and reactive to light bilaterally. Fundoscopic exam reveals clear disc margins bilaterally. Extraocular muscles are intact and visual fields are full to confrontational testing. Speech is fluent and clear. Soft palate rises symmetrically and there is no tongue deviation. Hearing is intact to conversational tone. Tone: Tone is good throughout. Sensation: Sensation is intact to light touch and pinprick throughout (facial, trunk, extremities). Vibration is intact at the bilateral big toe. There is no extinction with double simultaneous stimulation. There is no sensory dermatomal level identified. Coordination:  The patient has no dysdiadichokinesia or dysmetria. Motor: Strength is 5/5 in the bilateral upper and lower extremities.  Shoulder shrug is equal bilaterally.  There is no  pronator drift.  There are no fasciculations noted. DTR's: Deep tendon reflexes are 2/4 at the bilateral biceps, triceps, brachioradialis, patella and achilles.  Plantar responses are downgoing bilaterally. Gait and Station: The patient is able to arise out of the chair without her hands.  She walks slowly down the hall and her cane "drags" along the floor.    MOVEMENT EXAM: Tremor:  There is a tremor in the arms.  It is at rest and with intention.  The tremor at rest changes in quality with amplitude and frequency and is markedly less with distraction.  The postural tremor will match frequency when she is asked to tap out a beat.  Intermittent head tremor in "no" direction.     Assessment/Plan:  1.  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  I talked to her about those things and told her we could try primidone but not sure how much it would be helpful.  She doesn't want primidone and I agree.  Pt admits that stress is huge component.  Talked about counseling and role it plays in treatment.  I think that if stress under better control, tremor would be much better controlled as well.  Told her that I do not recommend benzo in treatment of tremor.  Much greater than 50% of this visit was spent in counseling and coordinating care.  Total face to face time:  45 min.  F/u prn.   CC:  Crawford Givens, MD

## 2016-04-15 ENCOUNTER — Ambulatory Visit (INDEPENDENT_AMBULATORY_CARE_PROVIDER_SITE_OTHER): Payer: Medicare Other | Admitting: Neurology

## 2016-04-15 ENCOUNTER — Encounter: Payer: Self-pay | Admitting: Neurology

## 2016-04-15 VITALS — BP 122/76 | HR 76 | Ht 63.5 in | Wt 131.0 lb

## 2016-04-15 DIAGNOSIS — G250 Essential tremor: Secondary | ICD-10-CM

## 2016-04-15 DIAGNOSIS — F444 Conversion disorder with motor symptom or deficit: Secondary | ICD-10-CM | POA: Diagnosis not present

## 2016-04-17 ENCOUNTER — Telehealth: Payer: Self-pay | Admitting: Family Medicine

## 2016-04-17 NOTE — Telephone Encounter (Signed)
Call pt.  I saw the notes from Dr. Arbutus Leasat about her tremor.  Per the notes, pt admits that stress is huge component of her sx.  Dr. Arbutus Leasat talked about counseling and role it plays in treatment.  Dr. Arbutus Leasat thinks that if stress under better control, tremor would be much better controlled as well.  Dr. Arbutus Leasat did not recommend benzo in treatment of tremor.   We need to make plans about tapering off BZD, setting up other treatment for anxiety/mood, ie other meds and/or counseling.  Let me know if she needs help getting counseling set up; o/w needs OV to discuss anxiety tx option.  Thanks. 30 min OV.

## 2016-04-18 NOTE — Telephone Encounter (Signed)
Left message on answering machine for patient to call back,

## 2016-04-18 NOTE — Telephone Encounter (Signed)
Patient notified as instructed by telephone and verbalized understanding. Patient stated that she does not want counseling. Patient stated that she will call back and schedule an appointment, but will have to make transportation arrangements first.

## 2016-04-18 NOTE — Telephone Encounter (Signed)
Noted. Thanks.

## 2016-04-19 ENCOUNTER — Encounter: Payer: Self-pay | Admitting: Family Medicine

## 2016-04-25 ENCOUNTER — Ambulatory Visit: Payer: Medicare Other | Admitting: Family Medicine

## 2016-04-26 ENCOUNTER — Other Ambulatory Visit: Payer: Self-pay | Admitting: Gastroenterology

## 2016-04-26 DIAGNOSIS — R1084 Generalized abdominal pain: Secondary | ICD-10-CM

## 2016-05-02 ENCOUNTER — Encounter: Payer: Self-pay | Admitting: Family Medicine

## 2016-05-02 ENCOUNTER — Ambulatory Visit (INDEPENDENT_AMBULATORY_CARE_PROVIDER_SITE_OTHER): Payer: Medicare Other | Admitting: Family Medicine

## 2016-05-02 DIAGNOSIS — R251 Tremor, unspecified: Secondary | ICD-10-CM | POA: Diagnosis not present

## 2016-05-02 MED ORDER — DULOXETINE HCL 30 MG PO CPEP: 30.0000 mg | ORAL_CAPSULE | Freq: Every day | ORAL | 3 refills | Status: DC

## 2016-05-02 NOTE — Patient Instructions (Signed)
Start taking cymbalta once a day.  After about 10 days, then start tapering down on the lorazepam.  You can take a half tabs instead of a whole tab of lorazepam and gradually taper down.  Update me as needed in the meantime.  I'd like to know how you are doing in about 1-2 weeks.  Take care.  Glad to see you.

## 2016-05-02 NOTE — Progress Notes (Signed)
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 This is a long-standing issue for patient. She saw Dr. Arbutus Leasat in the meantime.  Per Dr. 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  Dr. Don Perkingat's note was reviewed with patient at the office visit.  H/o depression and anxiety.  h/o electroshock therapy tx in the past, disclosed by patient today.  She was prev on paxil and prozac in the past w/o relief.    She continues to have urinary frequency.  No burning with urination.    She has had f/u with GI and had questions about endoscopy. I asked her to check with GI about those questions.  PMH and SH reviewed  ROS: Per HPI unless specifically indicated in ROS section   Meds, vitals, and allergies reviewed.   GEN: nad, alert and oriented HEENT: mucous membranes moist NECK: supple w/o LA CV: rrr.   PULM: ctab, no inc wob ABD: soft, +bs EXT: no edema Variable tremor noted in the head and hands.

## 2016-05-02 NOTE — Progress Notes (Signed)
Pre visit review using our clinic review tool, if applicable. No additional management support is needed unless otherwise documented below in the visit note. 

## 2016-05-03 ENCOUNTER — Encounter: Payer: Self-pay | Admitting: Family Medicine

## 2016-05-03 ENCOUNTER — Ambulatory Visit: Payer: Medicare Other

## 2016-05-03 NOTE — Assessment & Plan Note (Signed)
Discussed with patient about options. She has multiple reasons to try Cymbalta. It may help with fibromyalgia pain. It may help with her mood. If it helps with her mood, then that may help her tremor. Reasonable to try, especially since she is previously failed Paxil and Prozac per patient report. There is a very small risk of serotonin syndrome with concurrent tramadol use. Discussed with patient. If the Cymbalta helps her pain level significantly she may need less tramadol at baseline. At this point the likely benefit of the medication probably greatly outweighs the risk, so it is reasonable to proceed. All questions answered.  Will start taking cymbalta once a day.  After about 10 days, then start tapering down on the lorazepam.  She can take a half tabs instead of a whole tab of lorazepam and gradually taper down.  She'll update me as needed in the meantime.  I'd like to know how she is doing in about 1-2 weeks.   >25 minutes spent in face to face time with patient, >50% spent in counselling or coordination of care.

## 2016-05-16 ENCOUNTER — Ambulatory Visit
Admission: RE | Admit: 2016-05-16 | Discharge: 2016-05-16 | Disposition: A | Discharge status: Home / Self Care | Payer: Medicare Other | Source: Ambulatory Visit | Attending: Gastroenterology | Admitting: Gastroenterology

## 2016-05-16 DIAGNOSIS — R1084 Generalized abdominal pain: Secondary | ICD-10-CM | POA: Diagnosis not present

## 2016-05-16 DIAGNOSIS — I700 Atherosclerosis of aorta: Secondary | ICD-10-CM | POA: Diagnosis not present

## 2016-05-16 DIAGNOSIS — K5730 Diverticulosis of large intestine without perforation or abscess without bleeding: Secondary | ICD-10-CM | POA: Insufficient documentation

## 2016-05-16 DIAGNOSIS — R634 Abnormal weight loss: Secondary | ICD-10-CM | POA: Diagnosis not present

## 2016-05-16 LAB — POCT I-STAT CREATININE: CREATININE: 0.8 mg/dL (ref 0.44–1.00)

## 2016-05-16 MED ORDER — IOPAMIDOL (ISOVUE-300) INJECTION 61%
100.0000 mL | Freq: Once | INTRAVENOUS | Status: AC | PRN
  Administered 2016-05-16: 100 mL via INTRAVENOUS

## 2016-06-20 ENCOUNTER — Other Ambulatory Visit (INDEPENDENT_AMBULATORY_CARE_PROVIDER_SITE_OTHER): Payer: Medicare Other

## 2016-06-20 ENCOUNTER — Other Ambulatory Visit: Payer: Self-pay | Admitting: *Deleted

## 2016-06-20 DIAGNOSIS — R5383 Other fatigue: Secondary | ICD-10-CM

## 2016-06-20 DIAGNOSIS — F419 Anxiety disorder, unspecified: Secondary | ICD-10-CM

## 2016-06-20 MED ORDER — TRAZODONE HCL 50 MG PO TABS: 50.0000 mg | ORAL_TABLET | Freq: Every day | ORAL | 1 refills | Status: DC

## 2016-06-20 MED ORDER — TRAMADOL HCL 50 MG PO TABS: 50.0000 mg | ORAL_TABLET | Freq: Three times a day (TID) | ORAL | 1 refills | Status: DC | PRN

## 2016-06-20 NOTE — Telephone Encounter (Signed)
Pt requesting medication refills. Rx never prescribed by Dr Para Marchuncan. Last OV 04/2016-acute

## 2016-06-20 NOTE — Telephone Encounter (Signed)
Please call in Tramadol. Trazodone sent. She was going to taper down and stop lorazepam. Refill denied. Please get update on patient now that she has started Cymbalta. Thanks.

## 2016-06-21 NOTE — Telephone Encounter (Signed)
Medication phoned to pharmacy.  Patient says she was not able to take the Cymbalta because it made her "shake" worse.  Therefore, she did not taper the Lorazepam.  Please advise.

## 2016-06-22 MED ORDER — LORAZEPAM 0.5 MG PO TABS: 0.2500 mg | ORAL_TABLET | Freq: Three times a day (TID) | ORAL | 0 refills | Status: DC

## 2016-06-22 NOTE — Telephone Encounter (Signed)
Patient notified as instructed by telephone and verbalized understanding. Patient stated that the shakes started about two weeks ago and she stopped the Cymbalta about a week ago. Patient stated that when she shakes and takes the Lorazepam the shakes stop after about 30 minutes. Patient stated that she is willing to go for a psych appointment. Advised patient that one of the referral coordinators will be in touch with her to get this set up.

## 2016-06-22 NOTE — Telephone Encounter (Signed)
Thanks.  Shirlee LimerickMarion, see below.  Thanks.

## 2016-06-22 NOTE — Telephone Encounter (Signed)
If she had trouble on the cymbalta, then she should have stopped it.  If she had updated me previously, then we could have worked on this sooner rather than later.  Dr. Arbutus Leasat did not recommend benzo in treatment of tremor.  She'll need to taper off and/or she will need psych eval.   I am willing to rx short term for now to allow her to get psych f/u set.   I put in the referral.  Please call in the rx.  My plan is to slowly taper her rx for lorazepam o/w.   Thanks.

## 2016-06-25 LAB — DHEA: DHEA: 364 ng/dL (ref 102–1185)

## 2016-07-12 ENCOUNTER — Telehealth: Payer: Self-pay

## 2016-07-12 NOTE — Telephone Encounter (Signed)
I don't have other good options for her for the tremor.  Neuro thought that there was a psychogenic component and anxiety tx would help.  The best option is likely to f/u with psych.   Thanks.

## 2016-07-12 NOTE — Telephone Encounter (Signed)
Patient advised.  Patient says she has appt with psych but it is not until 2 months and she needs help before then.  Patient advised that this note will be sent but that Dr. Para Marchuncan will not be back until next week and she says she is fine with that.

## 2016-07-12 NOTE — Telephone Encounter (Signed)
Pt said the cymbalta does not help the shakes and pt wants cb from Dr Para Marchuncan when he returns; pt does not want to speak with another provider and does not want to schedule appt with Dr Para Marchuncan until gets cb from Dr Para Marchuncan next week. I offered to schedule appt or send note to another provider that is in the office this week but pt said no.

## 2016-07-13 NOTE — Telephone Encounter (Signed)
Have her call to get on the cancellation list in the meantime, if not already done.  I don't know of other good options for her in the meantime.  Thanks.

## 2016-07-14 NOTE — Telephone Encounter (Signed)
Patient advised.

## 2016-08-09 ENCOUNTER — Ambulatory Visit
Admission: RE | Admit: 2016-08-09 | Discharge: 2016-08-09 | Disposition: A | Discharge status: Home / Self Care | Payer: Medicare Other | Source: Ambulatory Visit | Attending: Gastroenterology | Admitting: Gastroenterology

## 2016-08-09 ENCOUNTER — Encounter: Payer: Self-pay | Admitting: *Deleted

## 2016-08-09 ENCOUNTER — Ambulatory Visit: Payer: Medicare Other | Admitting: Anesthesiology

## 2016-08-09 ENCOUNTER — Encounter
Admission: RE | Disposition: A | Discharge status: Home / Self Care | Payer: Self-pay | Source: Ambulatory Visit | Attending: Gastroenterology

## 2016-08-09 DIAGNOSIS — Z8719 Personal history of other diseases of the digestive system: Secondary | ICD-10-CM | POA: Diagnosis not present

## 2016-08-09 DIAGNOSIS — Z5309 Procedure and treatment not carried out because of other contraindication: Secondary | ICD-10-CM | POA: Diagnosis not present

## 2016-08-09 DIAGNOSIS — Z86010 Personal history of colonic polyps: Secondary | ICD-10-CM | POA: Diagnosis not present

## 2016-08-09 DIAGNOSIS — Z1211 Encounter for screening for malignant neoplasm of colon: Secondary | ICD-10-CM | POA: Diagnosis present

## 2016-08-09 DIAGNOSIS — M797 Fibromyalgia: Secondary | ICD-10-CM | POA: Diagnosis not present

## 2016-08-09 DIAGNOSIS — Z87891 Personal history of nicotine dependence: Secondary | ICD-10-CM | POA: Insufficient documentation

## 2016-08-09 DIAGNOSIS — E039 Hypothyroidism, unspecified: Secondary | ICD-10-CM | POA: Diagnosis not present

## 2016-08-09 DIAGNOSIS — Z79899 Other long term (current) drug therapy: Secondary | ICD-10-CM | POA: Insufficient documentation

## 2016-08-09 DIAGNOSIS — F329 Major depressive disorder, single episode, unspecified: Secondary | ICD-10-CM | POA: Diagnosis not present

## 2016-08-09 DIAGNOSIS — K219 Gastro-esophageal reflux disease without esophagitis: Secondary | ICD-10-CM | POA: Diagnosis not present

## 2016-08-09 DIAGNOSIS — I10 Essential (primary) hypertension: Secondary | ICD-10-CM | POA: Insufficient documentation

## 2016-08-09 SURGERY — ESOPHAGOGASTRODUODENOSCOPY (EGD) WITH PROPOFOL
Anesthesia: General

## 2016-08-09 MED ORDER — FENTANYL CITRATE (PF) 100 MCG/2ML IJ SOLN
INTRAMUSCULAR | Status: AC
  Filled 2016-08-09: qty 2

## 2016-08-09 MED ORDER — PROPOFOL 500 MG/50ML IV EMUL
INTRAVENOUS | Status: AC
  Filled 2016-08-09: qty 50

## 2016-08-09 MED ORDER — MIDAZOLAM HCL 2 MG/2ML IJ SOLN
INTRAMUSCULAR | Status: AC
  Filled 2016-08-09: qty 2

## 2016-08-09 MED ORDER — SODIUM CHLORIDE 0.9 % IV SOLN: INTRAVENOUS | Status: DC

## 2016-08-09 NOTE — Anesthesia Preprocedure Evaluation (Deleted)
Anesthesia Evaluation  Patient identified by MRN, date of birth, ID band Patient awake    Reviewed: Allergy & Precautions, H&P , NPO status , Patient's Chart, lab work & pertinent test results  History of Anesthesia Complications Negative for: history of anesthetic complications  Airway Mallampati: III  TM Distance: <3 FB Neck ROM: limited    Dental  (+) Chipped, Caps   Pulmonary neg shortness of breath, former smoker,           Cardiovascular Exercise Tolerance: Good hypertension, (-) angina(-) Past MI and (-) DOE      Neuro/Psych PSYCHIATRIC DISORDERS Depression  Neuromuscular disease    GI/Hepatic Neg liver ROS, hiatal hernia, GERD  Medicated and Controlled,  Endo/Other  Hypothyroidism   Renal/GU negative Renal ROS  negative genitourinary   Musculoskeletal  (+) Arthritis , Fibromyalgia -  Abdominal   Peds  Hematology negative hematology ROS (+)   Anesthesia Other Findings Past Medical History: No date: Arthritis No date: Depression No date: Environmental allergies No date: Fibromyalgia No date: Fibromyalgia No date: GERD (gastroesophageal reflux disease) No date: Hiatal hernia No date: History of brucellosis No date: History of colon polyps No date: History of stomach ulcers No date: HLD (hyperlipidemia) No date: HTN (hypertension) No date: Hypothyroidism No date: Tremor  Past Surgical History: No date: ABDOMINAL HYSTERECTOMY No date: CATARACT EXTRACTION Bilateral No date: CHOLECYSTECTOMY No date: COLONOSCOPY No date: INCONTINENCE SURGERY No date: LUMBAR SPINE SURGERY No date: TONSILLECTOMY No date: UPPER GASTROINTESTINAL ENDOSCOPY  BMI    Body Mass Index:  21.45 kg/m      Reproductive/Obstetrics negative OB ROS                             Anesthesia Physical Anesthesia Plan  ASA: III  Anesthesia Plan: General   Post-op Pain Management:    Induction:  Intravenous  PONV Risk Score and Plan: 3 and Ondansetron, Dexamethasone, Propofol and Midazolam  Airway Management Planned: Natural Airway and Nasal Cannula  Additional Equipment:   Intra-op Plan:   Post-operative Plan:   Informed Consent: I have reviewed the patients History and Physical, chart, labs and discussed the procedure including the risks, benefits and alternatives for the proposed anesthesia with the patient or authorized representative who has indicated his/her understanding and acceptance.   Dental Advisory Given  Plan Discussed with: Anesthesiologist, CRNA and Surgeon  Anesthesia Plan Comments: (Patient consented for risks of anesthesia including but not limited to:  - adverse reactions to medications - damage to teeth, lips or other oral mucosa - sore throat or hoarseness - Damage to heart, brain, lungs or loss of life  Patient voiced understanding.)        Anesthesia Quick Evaluation

## 2016-08-09 NOTE — H&P (Signed)
Patient presented today for EGD and colonoscopy in regards to personal history of Barrett's esophagus and colon polyps. She stated that she had only been able to get down about half of her prep. I decided to a rectal examination that showed stool in the rectal vault. As such we decided to cancel procedure today as I did not want to sedate her for just one of the procedures and we will arrange for her return after reprep.

## 2016-08-11 ENCOUNTER — Telehealth: Payer: Self-pay | Admitting: Family Medicine

## 2016-08-11 NOTE — Telephone Encounter (Signed)
-----   Message from Darliss RidgelAarti K Kapur, MD sent at 08/11/2016 10:37 AM EDT ----- I saw Ms. Leard and agree with tapering off Ativan, esp after reviewing Dr Don Perkingat's note. I am recommending she start an SSRI Celexa which she is resistant to. She is asking me for pain medication and believed I was the physician to prescribe pain medication. I let her know this is not my expertise.

## 2016-08-11 NOTE — Telephone Encounter (Signed)
Thank you for your input.  I appreciate your help.  GSD.

## 2016-09-12 ENCOUNTER — Encounter: Payer: Self-pay | Admitting: Family Medicine

## 2016-09-12 ENCOUNTER — Ambulatory Visit (INDEPENDENT_AMBULATORY_CARE_PROVIDER_SITE_OTHER): Payer: Medicare Other | Admitting: Family Medicine

## 2016-09-12 DIAGNOSIS — F329 Major depressive disorder, single episode, unspecified: Secondary | ICD-10-CM | POA: Diagnosis not present

## 2016-09-12 DIAGNOSIS — F32A Depression, unspecified: Secondary | ICD-10-CM

## 2016-09-12 MED ORDER — CITALOPRAM HYDROBROMIDE 10 MG PO TABS: 10.0000 mg | ORAL_TABLET | Freq: Every day | ORAL | Status: DC

## 2016-09-12 MED ORDER — TRAZODONE HCL 50 MG PO TABS: 25.0000 mg | ORAL_TABLET | Freq: Every day | ORAL | 1 refills | Status: DC

## 2016-09-12 NOTE — Progress Notes (Signed)
She slept better with trazodone.  D/w pt.  reasonable to continue.  Long-standing history of anxiety and depression. Previous had multiple forms of treatment. She continues to be anxious and depressed. She has noted social upheaval when she moved to the area. She has seen psychiatry recently. She started citalopram 10 mg a day. No noted effect from the medication but also no noted adverse effect. She discontinued medication in the meantime, likely prematurely. She tried taking Lamictal but had constipation with that.  She is still anxious, worried, tremulous. He was sister today. Sister agrees that the patient is depressed and anxious. No suicidal or homicidal intent.  PMH and SH reviewed  ROS: Per HPI unless specifically indicated in ROS section   Meds, vitals, and allergies reviewed.   GEN: nad, alert and oriented, but anxious appearing. She gets distracted during conversation but she is able to be reoriented. She has B hand tremor during the exam which decreases when she is distracted. HEENT: mucous membranes moist NECK: supple w/o LA CV: rrr. PULM: ctab, no inc wob ABD: soft, +bs EXT: no edema SKIN: no acute rash

## 2016-09-12 NOTE — Patient Instructions (Addendum)
I would try to restart citalopram in the meantime and I'll notify Dr. Maryruth BunKapur.   Use the trazodone at night and see if that helps with sleep- you can start with a half tab.  Take care.  Glad to see you.

## 2016-09-13 NOTE — Assessment & Plan Note (Signed)
Likely with depression and anxiety contributing to her tremor. Encouraged patient not to use benzodiazepine. She has previous fall history, so reasonable not to use benzodiazepine. This is especially true given her age. Discussed with patient and sister about rationale for not using benzodiazepine. She likely stopped citalopram prematurely. Reasonable to restart. Will route this note back to psychiatry, with appreciation for input and as an FYI. No suicidal or homicidal intent. Okay for outpatient follow-up. Trazodone has helped her sleep at night so it is likely okay to continue a low dose of this as needed. >25 minutes spent in face to face time with patient, >50% spent in counselling or coordination of care.

## 2016-09-27 ENCOUNTER — Other Ambulatory Visit: Payer: Self-pay

## 2016-09-27 NOTE — Telephone Encounter (Signed)
Kelsey LampWilliam [facility] request refill omeprazole, levothyroxine 112/ mcg and fluticasone to [facility]. Annual on 03/21/16; do not see meds mentioned in visit and Dr Para Marchuncan has never filled before.Please advise.

## 2016-09-28 MED ORDER — LEVOTHYROXINE SODIUM 112 MCG PO TABS
112.0000 ug | ORAL_TABLET | Freq: Every day | ORAL | 1 refills | Status: DC
Start: 2016-09-28 — End: 2017-03-07

## 2016-09-28 MED ORDER — PANTOPRAZOLE SODIUM 20 MG PO TBEC: 20.0000 mg | DELAYED_RELEASE_TABLET | Freq: Every day | ORAL | 1 refills | Status: DC

## 2016-09-28 MED ORDER — FLUTICASONE PROPIONATE 50 MCG/ACT NA SUSP: 2.0000 | Freq: Every day | NASAL | 3 refills | Status: DC

## 2016-09-28 NOTE — Telephone Encounter (Signed)
Sent.  Okay to continue except for omeprazole. Change to protonix given citalopram use.  Thanks.

## 2016-09-28 NOTE — Telephone Encounter (Signed)
Patient notified as instructed by telephone and verbalized understanding. 

## 2016-10-13 ENCOUNTER — Other Ambulatory Visit: Payer: Self-pay | Admitting: Neurology

## 2016-10-13 DIAGNOSIS — R251 Tremor, unspecified: Secondary | ICD-10-CM

## 2016-10-17 ENCOUNTER — Telehealth: Payer: Self-pay | Admitting: Family Medicine

## 2016-10-17 NOTE — Telephone Encounter (Signed)
-----   Message from Darliss RidgelAarti K Kapur, MD sent at 10/13/2016  3:59 PM EDT ----- Dr Para Marchuncan, I sent [name] to Dr Clydia LlanoHemang Shah(neurology) who had a different opinion than Dr Tat. He started her on Primidone which she plans to start tomorrow. He also reinforced the need for her to be off the Ativan. She has been agreeable to this and is completely off the medication. MRI Brain is pending as well. Just wanted to update you

## 2016-10-17 NOTE — Telephone Encounter (Signed)
Noted. Thanks.

## 2016-10-21 ENCOUNTER — Ambulatory Visit
Admission: RE | Admit: 2016-10-21 | Discharge: 2016-10-21 | Disposition: A | Discharge status: Home / Self Care | Payer: Medicare Other | Source: Ambulatory Visit | Attending: Neurology | Admitting: Neurology

## 2016-10-21 DIAGNOSIS — R251 Tremor, unspecified: Secondary | ICD-10-CM | POA: Insufficient documentation

## 2016-10-21 DIAGNOSIS — I6782 Cerebral ischemia: Secondary | ICD-10-CM | POA: Diagnosis not present

## 2016-12-28 ENCOUNTER — Other Ambulatory Visit: Payer: Self-pay | Admitting: Family Medicine

## 2016-12-28 MED ORDER — LOSARTAN POTASSIUM 100 MG PO TABS: 100.0000 mg | ORAL_TABLET | Freq: Every day | ORAL | 1 refills | Status: DC

## 2016-12-28 NOTE — Telephone Encounter (Signed)
Patient is calling for a refill of her losartan. She states she does not have any refills left.

## 2016-12-29 ENCOUNTER — Ambulatory Visit (INDEPENDENT_AMBULATORY_CARE_PROVIDER_SITE_OTHER): Payer: Medicare Other

## 2016-12-29 DIAGNOSIS — Z23 Encounter for immunization: Secondary | ICD-10-CM

## 2017-02-05 ENCOUNTER — Other Ambulatory Visit: Payer: Self-pay | Admitting: Family Medicine

## 2017-03-07 ENCOUNTER — Other Ambulatory Visit: Payer: Self-pay | Admitting: Family Medicine

## 2017-03-21 ENCOUNTER — Other Ambulatory Visit: Payer: Self-pay | Admitting: Family Medicine

## 2017-05-29 ENCOUNTER — Ambulatory Visit (INDEPENDENT_AMBULATORY_CARE_PROVIDER_SITE_OTHER): Payer: Medicare Other | Admitting: Family Medicine

## 2017-05-29 ENCOUNTER — Encounter: Payer: Self-pay | Admitting: Family Medicine

## 2017-05-29 ENCOUNTER — Other Ambulatory Visit: Payer: Self-pay | Admitting: Family Medicine

## 2017-05-29 ENCOUNTER — Ambulatory Visit (INDEPENDENT_AMBULATORY_CARE_PROVIDER_SITE_OTHER): Payer: Medicare Other

## 2017-05-29 VITALS — BP 138/78 | HR 75 | Temp 97.4°F | Ht 64.0 in | Wt 132.5 lb

## 2017-05-29 DIAGNOSIS — E039 Hypothyroidism, unspecified: Secondary | ICD-10-CM

## 2017-05-29 DIAGNOSIS — E785 Hyperlipidemia, unspecified: Secondary | ICD-10-CM

## 2017-05-29 DIAGNOSIS — I10 Essential (primary) hypertension: Secondary | ICD-10-CM

## 2017-05-29 DIAGNOSIS — G20 Parkinson's disease: Secondary | ICD-10-CM

## 2017-05-29 DIAGNOSIS — Z0000 Encounter for general adult medical examination without abnormal findings: Secondary | ICD-10-CM | POA: Diagnosis not present

## 2017-05-29 LAB — LIPID PANEL
CHOL/HDL RATIO: 4
Cholesterol: 206 mg/dL — ABNORMAL HIGH (ref 0–200)
HDL: 51.4 mg/dL (ref >39.00)
LDL Cholesterol: 128 mg/dL — ABNORMAL HIGH (ref 0–99)
NonHDL: 154.33
TRIGLYCERIDES: 134 mg/dL (ref 0.0–149.0)
VLDL: 26.8 mg/dL (ref 0.0–40.0)

## 2017-05-29 LAB — COMPREHENSIVE METABOLIC PANEL
ALK PHOS: 97 U/L (ref 39–117)
ALT: 0 U/L (ref 0–35)
AST: 12 U/L (ref 0–37)
Albumin: 4.4 g/dL (ref 3.5–5.2)
BILIRUBIN TOTAL: 0.9 mg/dL (ref 0.2–1.2)
BUN: 12 mg/dL (ref 6–23)
CALCIUM: 9.8 mg/dL (ref 8.4–10.5)
CO2: 29 meq/L (ref 19–32)
Chloride: 102 mEq/L (ref 96–112)
Creatinine, Ser: 0.71 mg/dL (ref 0.40–1.20)
GFR: 84.52 mL/min (ref >60.00)
Glucose, Bld: 93 mg/dL (ref 70–99)
POTASSIUM: 3.9 meq/L (ref 3.5–5.1)
Sodium: 138 mEq/L (ref 135–145)
Total Protein: 7.6 g/dL (ref 6.0–8.3)

## 2017-05-29 LAB — TSH: TSH: 0.53 u[IU]/mL (ref 0.35–4.50)

## 2017-05-29 MED ORDER — PROPRANOLOL HCL ER 60 MG PO CP24: 60.0000 mg | ORAL_CAPSULE | Freq: Every day | ORAL | Status: DC

## 2017-05-29 MED ORDER — CARBIDOPA-LEVODOPA ER 50-200 MG PO TBCR: 1.0000 | EXTENDED_RELEASE_TABLET | Freq: Two times a day (BID) | ORAL | Status: AC

## 2017-05-29 NOTE — Progress Notes (Signed)
Subjective:   Kelsey Christian is a 77 y.o. female who presents for Medicare Annual (Subsequent) preventive examination.  Review of Systems:  N/A Cardiac Risk Factors include: advanced age (>50men, >19 women);dyslipidemia;hypertension     Objective:     Vitals: BP 138/78 (BP Location: Left Arm, Patient Position: Sitting, Cuff Size: Normal)   Pulse 75   Temp (!) 97.4 F (36.3 C) (Oral)   Ht 5\' 4"  (1.626 m) Comment: shoes  Wt 132 lb 8 oz (60.1 kg)   SpO2 97%   BMI 22.74 kg/m   Body mass index is 22.74 kg/m.  Advanced Directives 05/29/2017 03/17/2016  Does Patient Have a Medical Advance Directive? No No  Would patient like information on creating a medical advance directive? No - Patient declined -    Tobacco Social History   Tobacco Use  Smoking Status Former Smoker  . Last attempt to quit: 04/15/1981  . Years since quitting: 36.1  Smokeless Tobacco Never Used     Counseling given: No   Clinical Intake:  Pre-visit preparation completed: Yes  Pain Score: 5 (buttocks)     Nutritional Status: BMI of 19-24  Normal Nutritional Risks: None Diabetes: No  How often do you need to have someone help you when you read instructions, pamphlets, or other written materials from your doctor or pharmacy?: 2 - Rarely What is the last grade level you completed in school?: 12th grade  Interpreter Needed?: No  Comments: pt lives with sister and her family Information entered by :: LPinson, LPN  Past Medical History:  Diagnosis Date  . Arthritis   . Depression   . Environmental allergies   . Fibromyalgia   . Fibromyalgia   . GERD (gastroesophageal reflux disease)   . Hiatal hernia   . History of brucellosis   . History of colon polyps   . History of stomach ulcers   . HLD (hyperlipidemia)   . HTN (hypertension)   . Hypothyroidism   . Tremor    Past Surgical History:  Procedure Laterality Date  . ABDOMINAL HYSTERECTOMY    . CATARACT EXTRACTION Bilateral   .  CHOLECYSTECTOMY    . COLONOSCOPY    . INCONTINENCE SURGERY    . LUMBAR SPINE SURGERY    . TONSILLECTOMY    . UPPER GASTROINTESTINAL ENDOSCOPY     Family History  Problem Relation Age of Onset  . Stroke Mother   . Heart disease Father    Social History   Socioeconomic History  . Marital status: Widowed    Spouse name: Not on file  . Number of children: Not on file  . Years of education: Not on file  . Highest education level: Not on file  Occupational History  . Not on file  Social Needs  . Financial resource strain: Not on file  . Food insecurity:    Worry: Not on file    Inability: Not on file  . Transportation needs:    Medical: Not on file    Non-medical: Not on file  Tobacco Use  . Smoking status: Former Smoker    Last attempt to quit: 04/15/1981    Years since quitting: 36.1  . Smokeless tobacco: Never Used  Substance and Sexual Activity  . Alcohol use: No  . Drug use: No  . Sexual activity: Not Currently  Lifestyle  . Physical activity:    Days per week: Not on file    Minutes per session: Not on file  . Stress: Not on file  Relationships  . Social connections:    Talks on phone: Not on file    Gets together: Not on file    Attends religious service: Not on file    Active member of club or organization: Not on file    Attends meetings of clubs or organizations: Not on file    Relationship status: Not on file  Other Topics Concern  . 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 carbidopa-levodopa (SINEMET CR) 50-200 MG tablet Take by mouth.  . carboxymethylcellulose 1 % ophthalmic solution 1 drop 3 (three) times daily.  . Cholecalciferol (VITAMIN D3 PO) Take 1 mL by mouth daily.  .  Cyanocobalamin (VITAMIN B-12 PO) Take 1 mL by mouth daily.  Marland Kitchen DHEA 25 MG CAPS Take 1 capsule (25 mg total) by mouth daily. Except on Sundays.  (take 6 days a week)  . diphenhydrAMINE (BENADRYL) 25 MG tablet Take 25 mg by mouth every 6 (six) hours as needed.  . fluticasone (FLONASE) 50 MCG/ACT nasal spray SPRAY 2 SPRAYS INTO EACH NOSTRIL EVERY DAY  . gabapentin (NEURONTIN) 100 MG capsule TAKE 1 CAPSULE (100 MG TOTAL) BY MOUTH ONCE DAILY PATIENT IS TAKING ADDITIONAL 300 MG AT NIGHT  . gabapentin (NEURONTIN) 300 MG capsule TAKE 1 CAPSULE (300 MG TOTAL) BY MOUTH NIGHTLY AFTER INITIAL TITRATION  . hypromellose (GENTEAL SEVERE) 0.3 % GEL ophthalmic ointment Place into both eyes once.  Marland Kitchen levothyroxine (SYNTHROID, LEVOTHROID) 112 MCG tablet TAKE 1 TABLET (112 MCG TOTAL) BY MOUTH DAILY BEFORE BREAKFAST.  Marland Kitchen loratadine (CLARITIN) 10 MG tablet Take 10 mg by mouth daily.  Marland Kitchen losartan (COZAAR) 100 MG tablet Take 1 tablet (100 mg total) daily by mouth.  . pantoprazole (PROTONIX) 20 MG tablet TAKE 1 TABLET BY MOUTH EVERY DAY  . polyvinyl alcohol-povidone (HYPOTEARS) 1.4-0.6 % ophthalmic solution Place 1-2 drops into both eyes as needed.  . Psyllium (RA FIBER SUPPLEMENT PO) Take by mouth daily.  . traMADol (ULTRAM) 50 MG tablet Take 1 tablet (50 mg total) by mouth 3 (three) times daily as needed.  . verapamil (CALAN-SR) 180 MG CR tablet Take 180 mg by mouth 2 (two) times daily.  . [DISCONTINUED] citalopram (CELEXA) 10 MG tablet Take 1 tablet (10 mg total) by mouth daily.  . [DISCONTINUED] omeprazole (PRILOSEC) 20 MG capsule Take 20 mg by mouth daily.  . [DISCONTINUED] traZODone (DESYREL) 50 MG tablet Take 0.5-1 tablets (25-50 mg total) by mouth at bedtime.   No facility-administered encounter medications on file as of 05/29/2017.     Activities of Daily Living In your present state of health, do you have any difficulty performing the following activities: 05/29/2017  Hearing? N  Vision? N  Difficulty  concentrating or making decisions? Y  Walking or climbing stairs? Y  Dressing or bathing? N  Doing errands, shopping? Y  Preparing Food and eating ? N  Using the Toilet? N  In the past six months, have you accidently leaked urine? N  Do you have problems with loss of bowel control? N  Managing your Medications? N  Managing your Finances? N  Housekeeping or managing your Housekeeping? N  Some recent data might be hidden    Patient Care  Team: Joaquim Namuncan, Graham S, MD as PCP - General (Family Medicine) Darliss RidgelKapur, Aarti K, MD as Referring Physician (Psychiatry)    Assessment:   This is a routine wellness examination for Randa EvensJoanne.   Hearing Screening   125Hz  250Hz  500Hz  1000Hz  2000Hz  3000Hz  4000Hz  6000Hz  8000Hz   Right ear:   40 40 40  40    Left ear:   0 40 40  40      Visual Acuity Screening   Right eye Left eye Both eyes  Without correction: 20/25 20/30 20/25   With correction:        Exercise Activities and Dietary recommendations Current Exercise Habits: The patient does not participate in regular exercise at present, Exercise limited by: neurologic condition(s);orthopedic condition(s)  Goals    . Follow up with Primary Care Provider     Starting 05/29/2017, I will continue to take medications as prescribed and to follow up with PCP as scheduled.        Fall Risk Fall Risk  05/29/2017 04/15/2016 03/17/2016  Falls in the past year? No Yes No  Number falls in past yr: - 2 or more -  Injury with Fall? - No -  Risk Factor Category  - High Fall Risk -  Follow up - Falls evaluation completed -   Depression Screen PHQ 2/9 Scores 05/29/2017 03/17/2016  PHQ - 2 Score 0 0  PHQ- 9 Score 0 -     Cognitive Function MMSE - Mini Mental State Exam 05/29/2017 03/17/2016  Orientation to time 5 5  Orientation to Place 5 5  Registration 3 3  Attention/ Calculation 0 0  Recall 3 1  Recall-comments - pt was unable to recall 2 of 3 words  Language- name 2 objects 0 0  Language- repeat 1 1    Language- follow 3 step command 3 3  Language- read & follow direction 0 0  Write a sentence 0 0  Copy design 0 0  Total score 20 18     PLEASE NOTE: A Mini-Cog screen was completed. Maximum score is 20. A value of 0 denotes this part of Folstein MMSE was not completed or the patient failed this part of the Mini-Cog screening.   Mini-Cog Screening Orientation to Time - Max 5 pts Orientation to Place - Max 5 pts Registration - Max 3 pts Recall - Max 3 pts Language Repeat - Max 1 pts Language Follow 3 Step Command - Max 3 pts     Immunization History  Administered Date(s) Administered  . Influenza,inj,Quad PF,6+ Mos 12/29/2016  . Influenza-Unspecified 11/15/2015  . Pneumococcal Conjugate-13 11/15/2015  . Pneumococcal Polysaccharide-23 12/15/2016    Screening Tests Health Maintenance  Topic Date Due  . TETANUS/TDAP  03/17/2026 (Originally 12/17/1959)  . INFLUENZA VACCINE  09/14/2017  . DEXA SCAN  Completed  . PNA vac Low Risk Adult  Completed       Plan:     I have personally reviewed, addressed, and noted the following in the patient's chart:  A. Medical and social history B. Use of alcohol, tobacco or illicit drugs  C. Current medications and supplements D. Functional ability and status E.  Nutritional status F.  Physical activity G. Advance directives H. List of other physicians I.  Hospitalizations, surgeries, and ER visits in previous 12 months J.  Vitals K. Screenings to include hearing, vision, cognitive, depression L. Referrals and appointments - none  In addition, I have reviewed and discussed with patient certain preventive protocols, quality metrics, and best practice  recommendations. A written personalized care plan for preventive services as well as general preventive health recommendations were provided to patient.  See attached scanned questionnaire for additional information.   Signed,   Lindell Noe, MHA, BS, LPN Health Coach

## 2017-05-29 NOTE — Progress Notes (Signed)
PCP notes:   Health maintenance:  No gaps identified.   Abnormal screenings:   Hearing - failed  Hearing Screening   125Hz  250Hz  500Hz  1000Hz  2000Hz  3000Hz  4000Hz  6000Hz  8000Hz   Right ear:   40 40 40  40    Left ear:   0 40 40  40      Patient concerns:   None  Nurse concerns:  PCP please address blood pressure medication.  Next PCP appt:   05/29/2017 @ 1400  I reviewed health advisor's note, was available for consultation on the day of service listed in this note, and agree with documentation and plan. Crawford GivensGraham Duncan, MD.

## 2017-05-29 NOTE — Patient Instructions (Addendum)
Don't change your meds for now.   Let me talk to neurology about your meds.   Let me check on options about getting you some help on your medication arrangements.   We'll contact you with your lab report. Take care.  Glad to see you.

## 2017-05-29 NOTE — Progress Notes (Signed)
She recently saw neurology, d/w pt.  Plan per neuro:  1. Atypical Parkinsonism with abnormal DaTscan  - Stop Carbidopa - Levodopa CR 25/100 mg - Start Carbidopa levodopa CR 50/200 mg two times a day, one tablet in the morning and one tablet at night.  - Continue Propanol LA 60 mg once a day - Continue verapamil 120 mg once a day  2. Insomnia - Improved - Continue trazodone 100 mg nightly.  3. Numbness of feet - stable - Continue gabapentin 300 mg nightly.  - Start gabapentin 100 mg in the morning.  - Recommended to start alpha lipoic acid 600mg  daily.   4. Bilateral lower extremity edema -  - Recommended to wear large sized compression stockings regularly to reduce swelling. Recommended large compression with 15-20 mmHg pressure. There are zipper compression socks available that are easier to put on.   Return in about 1 month (around 06/24/2017) for parkinsons, peripheral neuropathy, [facility].   ==================================================== The above was d/w pt. we talked about Parkinson's diagnosis and pathophysiology/treatment in general.  She said she is understood her situation better after a conversation.  Hearing - failed.  D/w pt.  Declined hearing aids.    Hypothyroidism.  No neck mass.  No dysphagia.  Compliant with med. Labs pending.   Hypertension:    Using medication without problems or lightheadedness: yes Chest pain with exertion:no Edema:no Short of breath: at baseline with BLE edema Other issues: I need clarity on her current BB and CCB dosing.     PMH and SH reviewed  ROS: Per HPI unless specifically indicated in ROS section   Meds, vitals, and allergies reviewed.   GEN: nad, alert and oriented HEENT: mucous membranes moist NECK: supple w/o LA CV: rrr PULM: ctab, no inc wob ABD: soft, +bs EXT: no edema SKIN: no acute rash B hand tremor noted, along with lesser jaw tremor.

## 2017-05-29 NOTE — Patient Instructions (Addendum)
[name] , Thank you for taking time to come for your Medicare Wellness Visit. I appreciate your ongoing commitment to your health goals. Please review the following plan we discussed and let me know if I can assist you in the future.   These are the goals we discussed: Goals    . Follow up with Primary Care Provider     Starting 05/29/2017, I will continue to take medications as prescribed and to follow up with PCP as scheduled.        This is a list of the screening recommended for you and due dates:  Health Maintenance  Topic Date Due  . Tetanus Vaccine  03/17/2026*  . Flu Shot  09/14/2017  . DEXA scan (bone density measurement)  Completed  . Pneumonia vaccines  Completed  *Topic was postponed. The date shown is not the original due date.   Preventive Care for Adults  A healthy lifestyle and preventive care can promote health and wellness. Preventive health guidelines for adults include the following key practices.  . A routine yearly physical is a good way to check with your health care provider about your health and preventive screening. It is a chance to share any concerns and updates on your health and to receive a thorough exam.  . Visit your dentist for a routine exam and preventive care every 6 months. Brush your teeth twice a day and floss once a day. Good oral hygiene prevents tooth decay and gum disease.  . The frequency of eye exams is based on your age, health, family medical history, use  of contact lenses, and other factors. Follow your health care provider's recommendations for frequency of eye exams.  . Eat a healthy diet. Foods like vegetables, fruits, whole grains, low-fat dairy products, and lean protein foods contain the nutrients you need without too many calories. Decrease your intake of foods high in solid fats, added sugars, and salt. Eat the right amount of calories for you. Get information about a proper diet from your health care provider, if  necessary.  . Regular physical exercise is one of the most important things you can do for your health. Most adults should get at least 150 minutes of moderate-intensity exercise (any activity that increases your heart rate and causes you to sweat) each week. In addition, most adults need muscle-strengthening exercises on 2 or more days a week.  Silver Sneakers may be a benefit available to you. To determine eligibility, you may visit the website: www.silversneakers.com or contact program at [phone])[phone] Mon-Fri between 8AM-8PM.   . Maintain a healthy weight. The body mass index (BMI) is a screening tool to identify possible weight problems. It provides an estimate of body fat based on height and weight. Your health care provider can find your BMI and can help you achieve or maintain a healthy weight.   For adults 20 years and older: ? A BMI below 18.5 is considered underweight. ? A BMI of 18.5 to 24.9 is normal. ? A BMI of 25 to 29.9 is considered overweight. ? A BMI of 30 and above is considered obese.   . Maintain normal blood lipids and cholesterol levels by exercising and minimizing your intake of saturated fat. Eat a balanced diet with plenty of fruit and vegetables. Blood tests for lipids and cholesterol should begin at age 79 and be repeated every 5 years. If your lipid or cholesterol levels are high, you are over 50, or you are at high risk for heart  disease, you may need your cholesterol levels checked more frequently. Ongoing high lipid and cholesterol levels should be treated with medicines if diet and exercise are not working.  . If you smoke, find out from your health care provider how to quit. If you do not use tobacco, please do not start.  . If you choose to drink alcohol, please do not consume more than 2 drinks per day. One drink is considered to be 12 ounces (355 mL) of beer, 5 ounces (148 mL) of wine, or 1.5 ounces (44 mL) of liquor.  . If you are 75-52 years old, ask your  health care provider if you should take aspirin to prevent strokes.  . Use sunscreen. Apply sunscreen liberally and repeatedly throughout the day. You should seek shade when your shadow is shorter than you. Protect yourself by wearing long sleeves, pants, a wide-brimmed hat, and sunglasses year round, whenever you are outdoors.  . Once a month, do a whole body skin exam, using a mirror to look at the skin on your back. Tell your health care provider of new moles, moles that have irregular borders, moles that are larger than a pencil eraser, or moles that have changed in shape or color.

## 2017-05-30 DIAGNOSIS — G20 Parkinson's disease: Secondary | ICD-10-CM | POA: Insufficient documentation

## 2017-05-30 NOTE — Assessment & Plan Note (Signed)
No neck mass.  No dysphagia.  Compliant with med. Labs pending.

## 2017-05-30 NOTE — Assessment & Plan Note (Signed)
There are multiple issues here.  I talked with her about parkinsonian pathophysiology and treatment plan.  I need clarity about her current medications.  [facility]  Will contact neurology for clarity about her medications and doses.  She is worried that she will confuse her home meds and accidentally not comply with treatment.  I did not change her medications at this point.  D/w pt.  She agrees.   >25 minutes spent in face to face time with patient, >50% spent in counselling or coordination of care, discussing Parkinson's, items described above.

## 2017-05-30 NOTE — Assessment & Plan Note (Signed)
Reasonable control for now.  No change in meds.  She has some edema at baseline but I would not change her meds without seeing her labs first.  I am hesitant to treat the edema with medication unless it becomes more bothersome to the patient.  I think she will have difficulty putting compression stockings on herself.

## 2017-05-31 ENCOUNTER — Telehealth: Payer: Self-pay | Admitting: Family Medicine

## 2017-05-31 NOTE — Telephone Encounter (Signed)
[facility].  [facility] can't help this patient.  I need someone to go out to the home to assess the patient and her meds.  Do you have any ideas about how to make this possible?  I'm open to any suggestions.  Thanks.   Routed to L Fuquay, M Kolovrat, A Chambers.

## 2017-06-01 ENCOUNTER — Other Ambulatory Visit: Payer: Self-pay | Admitting: Family Medicine

## 2017-06-01 ENCOUNTER — Encounter: Payer: Self-pay | Admitting: Family Medicine

## 2017-06-01 NOTE — Telephone Encounter (Signed)
Dr. Para Marchuncan, I am asking my pharmacy resource. Will let you know what she says. Maybe good to put home health referral in as well but needs a skilled nursing need with additional med management help.

## 2017-06-04 ENCOUNTER — Telehealth: Payer: Self-pay | Admitting: Family Medicine

## 2017-06-04 NOTE — Telephone Encounter (Signed)
-----   Message from Annamarie MajorLugene S Fuquay, [place] sent at 06/01/2017  3:37 PM EDT ----- Leotis ShamesLauren at [facility] advised Propranolol is 60 mg once daily and Verapamil is 120 mg once daily ----- Message ----- From: Annamarie MajorFuquay, Lugene S, CMA Sent: 05/30/2017  11:24 AM To: Annamarie MajorLugene S Fuquay, CMA  Receptionist will give message to the nurse who will call back with info. ----- Message ----- From: Joaquim Namuncan, Susane Bey S, MD Sent: 05/29/2017   2:38 PM To: Annamarie MajorLugene S Fuquay, CMA  Call neuro.  I need clarification about her med doses, esp goal doses/sig of verapamil and propranolol.  She was confused about her meds/doses at [facility] today.  Thanks.   Jolene ProvostShah, Hemang Kalpeshkumar, MD  [phone] [facility] [address]  [facility]  [place], [place] [address]  [phone]  [phone] (Fax)   Clelia CroftShaw

## 2017-06-04 NOTE — Telephone Encounter (Signed)
Thanks.  Notify pt to continue as we have listed in the EMR.   Revonda Standardllison is checking with pharmacy resources in the meantime.

## 2017-06-04 NOTE — Telephone Encounter (Signed)
Let me know what you hear from pharmacy.  I don't see a skilled need at this point that would drive the [facility].  Thanks.

## 2017-06-05 NOTE — Telephone Encounter (Signed)
Verapamil was previously 180 mg bid and I changed it according to what the nurse at Neuro reported to me.  Check medication history.

## 2017-06-05 NOTE — Telephone Encounter (Signed)
Yes, agreed.  Med list is accurate with verapamil 120 QD and propranolol 60 QD.  Would continue with that.  Thanks.

## 2017-06-05 NOTE — Telephone Encounter (Signed)
Patient advised.  Patient says that she is not sure if she is taking her medications as she should be because she gets very confused and sometimes she cannot even verbalize her thoughts.  Patient is asking if a nurse could come out and help her with her medications and that she had mentioned that to you at her visit.  I suggested something like PillPak but she says her medications change a lot and she is not sure that would work.  Any thoughts, [facility] maybe?

## 2017-06-06 NOTE — Telephone Encounter (Signed)
She didn't qualify for [facility] and Revonda Standardllison is checking on pharmacy resources. Thanks.

## 2017-06-12 ENCOUNTER — Telehealth: Payer: Self-pay | Admitting: Family Medicine

## 2017-06-12 ENCOUNTER — Other Ambulatory Visit: Payer: Self-pay | Admitting: Family Medicine

## 2017-06-12 DIAGNOSIS — G20 Parkinson's disease: Secondary | ICD-10-CM

## 2017-06-12 NOTE — Telephone Encounter (Signed)
Phoned Andrey Campanile, patient's sister and helped to try to distinguish some of her medications.  Andrey Campanile says that someone from one of the [employer] called today and is planning to come out tomorrow to see what resources they can maybe provide.

## 2017-06-12 NOTE — Telephone Encounter (Signed)
HH referral ordered.

## 2017-06-12 NOTE — Telephone Encounter (Signed)
Copied from CRM [id number]. Topic: Quick Communication - See Telephone Encounter >> Jun 12, 2017  1:02 PM Rudi Coco, [place] wrote: CRM for notification. See Telephone encounter for: 06/12/17.  Pt. Sister Elizebeth Brooking calling to talk with nurse about pt. Med. Did not know the name of med. Andrey Campanile can be reached at [phone]

## 2017-06-16 ENCOUNTER — Telehealth: Payer: Self-pay | Admitting: Family Medicine

## 2017-06-16 NOTE — Telephone Encounter (Signed)
Ginger with [facility] called requesting verbal orders for pt 1x wk for 5 weeks. 3 PRN if needed. She also requested occ therapy added to eval and treat pt. Ginger's contact [phone]

## 2017-06-16 NOTE — Telephone Encounter (Signed)
Please give the order.  Thanks.   

## 2017-06-16 NOTE — Telephone Encounter (Signed)
Left detailed message on voicemail of Ginger with [facility].

## 2017-06-19 ENCOUNTER — Telehealth: Payer: Self-pay | Admitting: Family Medicine

## 2017-06-19 ENCOUNTER — Other Ambulatory Visit: Payer: Self-pay | Admitting: Family Medicine

## 2017-06-19 NOTE — Telephone Encounter (Signed)
Kelsey Christian advised. 

## 2017-06-19 NOTE — Telephone Encounter (Signed)
Please give the order.  Thanks.   

## 2017-06-19 NOTE — Telephone Encounter (Signed)
Copied from CRM [id number]. Topic: Quick Communication - See Telephone Encounter >> Jun 19, 2017 10:05 AM Floria Raveling A wrote: CRM for notification. See Telephone encounter for: 06/19/17. Boneta Lucks with [facility] [phone] [place] for PT  1 week 1 2 week 4

## 2017-06-20 ENCOUNTER — Other Ambulatory Visit: Payer: Self-pay | Admitting: Family Medicine

## 2017-06-23 ENCOUNTER — Telehealth: Payer: Self-pay

## 2017-06-23 NOTE — Telephone Encounter (Signed)
Copied from CRM [id number]. Topic: Quick Communication - See Telephone Encounter >> Jun 23, 2017  9:05 AM Herby Abraham C wrote: CRM for notification. See Telephone encounter for: 06/23/17.   Cordelia Pen w/ [facility] called in to request verbal orders frequency : 1 week 1, 2 week 2, 1 week 1 and social work order for evaluation and treatment   CB: [phone]  -- Okay to lvm

## 2017-06-25 NOTE — Telephone Encounter (Signed)
Please give the order.  Thanks.   

## 2017-06-26 NOTE — Telephone Encounter (Signed)
Verbal order given to [facility] as instructed.

## 2017-06-29 ENCOUNTER — Telehealth: Payer: Self-pay | Admitting: Family Medicine

## 2017-06-29 NOTE — Telephone Encounter (Signed)
Please give the order.  Thanks.   

## 2017-06-29 NOTE — Telephone Encounter (Signed)
Left detailed message on voicemail.  

## 2017-06-29 NOTE — Telephone Encounter (Signed)
Copied from CRM [id number]. Topic: Quick Communication - See Telephone Encounter >> Jun 29, 2017  1:25 PM Lorrine Kin, NT wrote: CRM for notification. See Telephone encounter for: 06/29/17. Marlynn Perking, physical therapist with [facility], calling and is needing verbal orders for the following: Add a home health aide to the current plan of care to assist with showers.   CB#: [phone] (Confidential voicemail)

## 2017-07-01 DIAGNOSIS — M797 Fibromyalgia: Secondary | ICD-10-CM

## 2017-07-01 DIAGNOSIS — R600 Localized edema: Secondary | ICD-10-CM

## 2017-07-01 DIAGNOSIS — Z741 Need for assistance with personal care: Secondary | ICD-10-CM | POA: Diagnosis not present

## 2017-07-01 DIAGNOSIS — I10 Essential (primary) hypertension: Secondary | ICD-10-CM

## 2017-07-01 DIAGNOSIS — Z9181 History of falling: Secondary | ICD-10-CM | POA: Diagnosis not present

## 2017-07-01 DIAGNOSIS — G20 Parkinson's disease: Secondary | ICD-10-CM

## 2017-07-10 ENCOUNTER — Other Ambulatory Visit: Payer: Self-pay | Admitting: Family Medicine

## 2017-07-11 ENCOUNTER — Telehealth: Payer: Self-pay | Admitting: Family Medicine

## 2017-07-11 NOTE — Telephone Encounter (Signed)
Copied from CRM [id number]. Topic: Quick Communication - See Telephone Encounter >> Jul 11, 2017  9:37 AM Jolayne Haines L wrote: CRM for notification. See Telephone encounter for: 07/11/17.  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 Please call back @ [phone] It is a confidential voicemail, can leave message.

## 2017-07-12 NOTE — Telephone Encounter (Signed)
[name] called from [facility] home health so see if Dr. Para March and expedite the order because pt will have a missed visit today if not.  She can be reached at [phone].

## 2017-07-12 NOTE — Telephone Encounter (Signed)
Ms Kelsey Christian with [facility] called to ck on response for OT extension; Ms Kelsey Christian said if she does not get response back pt will have missed visit today. I advised Ms Kelsey Christian that we received this request on 07/11/17 and the note was sent to provider and will give cb when get response from provider. Ms Kelsey Christian voiced understanding and will wait for cb.

## 2017-07-12 NOTE — Telephone Encounter (Signed)
Verbal order given to [facility] as instructed by telephone.

## 2017-07-12 NOTE — Telephone Encounter (Signed)
Please give the order.  Thanks.   

## 2017-07-14 ENCOUNTER — Telehealth: Payer: Self-pay | Admitting: Family Medicine

## 2017-07-14 NOTE — Telephone Encounter (Signed)
LMOVM of ok for verbal order/thx dmf

## 2017-07-14 NOTE — Telephone Encounter (Signed)
Please give the order.  Thanks.   

## 2017-07-14 NOTE — Telephone Encounter (Signed)
Ron Ladona Ridgelaylor with [facility] called to request 2 additional visits for social work. 1x a week for 2 wks and this is community resources. Please call [phone] with verbal order.

## 2017-07-20 ENCOUNTER — Telehealth: Payer: Self-pay | Admitting: Family Medicine

## 2017-07-20 NOTE — Telephone Encounter (Signed)
Copied from CRM [id number]. Topic: Quick Communication - See Telephone Encounter >> Jul 20, 2017  1:38 PM Louie BunPalacios Medina, Rosey Batheresa D wrote: CRM for notification. See Telephone encounter for: 07/20/17. Shirley FriarSharame Miller with [facility] called and would like verbal orders for patient as follow: add nursing and home health aid to eval and treat patient, second she would like to extend OT starting week of June 16 for 2x a week for 2 week. She can be reached at [phone]. A voicemail can be left.

## 2017-07-21 NOTE — Telephone Encounter (Signed)
Please give the order.  Thanks.   

## 2017-07-21 NOTE — Telephone Encounter (Signed)
Left detailed message on voicemail of Shirley FriarSharame Miller.

## 2017-07-24 ENCOUNTER — Telehealth: Payer: Self-pay | Admitting: Family Medicine

## 2017-07-24 NOTE — Telephone Encounter (Signed)
Okay. Please give the order.  Thanks.

## 2017-07-24 NOTE — Telephone Encounter (Signed)
Best number [phone]  Ron @ [facility] Wanted verbal order to discontinue scoial work for this only per pt request

## 2017-07-24 NOTE — Telephone Encounter (Signed)
[facility] with [facility] advised.

## 2017-08-31 ENCOUNTER — Ambulatory Visit: Payer: Medicare Other | Admitting: Family Medicine

## 2017-09-08 ENCOUNTER — Encounter: Payer: Self-pay | Admitting: Family Medicine

## 2017-09-08 ENCOUNTER — Ambulatory Visit (INDEPENDENT_AMBULATORY_CARE_PROVIDER_SITE_OTHER): Payer: Medicare Other | Admitting: Family Medicine

## 2017-09-08 DIAGNOSIS — I10 Essential (primary) hypertension: Secondary | ICD-10-CM

## 2017-09-08 DIAGNOSIS — G20 Parkinson's disease: Secondary | ICD-10-CM | POA: Diagnosis not present

## 2017-09-08 DIAGNOSIS — K219 Gastro-esophageal reflux disease without esophagitis: Secondary | ICD-10-CM | POA: Diagnosis not present

## 2017-09-08 MED ORDER — PANTOPRAZOLE SODIUM 20 MG PO TBEC: 20.0000 mg | DELAYED_RELEASE_TABLET | Freq: Every day | ORAL | 1 refills | Status: AC

## 2017-09-08 NOTE — Patient Instructions (Signed)
Your BP and pulse are fine.  Don't change your meds for now.  Check your meds list at home and let me know if you need any other refill.  Take care.  Glad to see you.  I wish you only the best with you move.

## 2017-09-08 NOTE — Progress Notes (Signed)
Discussed with patient about recent events.  Her son-in-law recently died.  Condolences offered.  She is going to move to [place] to live with her daughter.  This is the daughter who was recently widowed as described above.  The patient's sister also recently had to have multiple surgeries.    She has been followed by neurology for Parkinson's.  She is compliant with current medications.  Updated her med list based on her report of medications.  This was done to the best of my ability at the office visit.  No adverse effect on medication.  She still has a variable tremor at baseline.  When she gets to [place] she will likely need to reestablish with another neurology clinic there.  I will defer for now.  She agrees.  HTN.  She is not having chest pain.  She is not short of breath but she is relatively deconditioned.  No bradycardia on current medications.  No adverse effect on current medications.  Able to tolerate propranolol and verapamil.  She needed refill on PPI. Done at [facility].  No ADE on med. No heartburn with med use.    PMH and SH reviewed  ROS: Per HPI unless specifically indicated in ROS section   Meds, vitals, and allergies reviewed.   GEN: nad, alert and oriented HEENT: mucous membranes moist NECK: supple w/o LA CV: rrr PULM: ctab, no inc wob ABD: soft, +bs EXT: no edema SKIN: no acute rash In wheelchair.  Resting tremor noted at baseline.  This is symmetric.

## 2017-09-10 DIAGNOSIS — K219 Gastro-esophageal reflux disease without esophagitis: Secondary | ICD-10-CM | POA: Insufficient documentation

## 2017-09-10 NOTE — Assessment & Plan Note (Signed)
She has been followed by neurology for Parkinson's.  She is compliant with current medications.  Updated her med list based on her report of medications.  This was done to the best of my ability at the office visit.  No adverse effect on medication.  She still has a variable tremor at baseline.  When she gets to [place] she will likely need to reestablish with another neurology clinic there.  I will defer for now.  She agrees. >25 minutes spent in face to face time with patient, >50% spent in counselling or coordination of care.

## 2017-09-10 NOTE — Assessment & Plan Note (Signed)
No change in meds at this point.  Still okay for outpatient follow-up.  I gave her a copy of her med list.  Also talked with her brother-in-law who was at the office visit today.  This was done with the patient's permission.  He will verify her current medication list at home and update me as needed, if there are any discrepancies.  Patient and brother-in-law both agree.  I have been glad to see this patient here in the clinic and I wish her only the best, especially with the recent appeal noted in her pending move to [place].

## 2017-09-10 NOTE — Assessment & Plan Note (Signed)
Reasonable to continue PPI for now.  No adverse effect on medication.  Refill done.  Update me as needed.

## 2017-09-11 ENCOUNTER — Telehealth: Payer: Self-pay

## 2017-09-11 MED ORDER — LEVOTHYROXINE SODIUM 112 MCG PO TABS: 112.0000 ug | ORAL_TABLET | Freq: Every day | ORAL | 0 refills | Status: AC

## 2017-09-11 NOTE — Telephone Encounter (Signed)
Copied from CRM [id number]. Topic: Quick Communication - See Telephone Encounter >> Sep 11, 2017 11:47 AM Herby AbrahamJohnson, Shiquita C wrote: CRM for notification. See Telephone encounter for: 09/11/17.  William/ pt's family member is calling in to speak with CMA, he said that he have information for PCP and would like to speak with CMA directly.  CB:   [phone]

## 2017-09-11 NOTE — Telephone Encounter (Signed)
I spoke with Kelsey Christian; pt is no longer taking Benadryl  Which is a prn due to her age per neurology [facility]. Pt has not taken propranolol for 2 months; pt stopped med due to instructions from [facility]. Pt last seen 09/08/17. Also pt request one more 90 day refill for levothyroxine to [facility] due to pts move. Refill sent and note sent to Dr Para Marchuncan as Lorain ChildesFYI.

## 2017-09-12 NOTE — Telephone Encounter (Signed)
Med list updated. Thanks.  

## 2017-10-19 ENCOUNTER — Telehealth: Payer: Self-pay | Admitting: Family Medicine

## 2017-10-19 NOTE — Telephone Encounter (Signed)
Message left on patient's VM that the easiest method is to transfer any refills remaining from the pharmacy in [place] to the pharmacy in [place].

## 2017-10-19 NOTE — Telephone Encounter (Signed)
Copied from CRM [id number]. Topic: Quick Communication - See Telephone Encounter >> Oct 19, 2017  2:06 PM Waymon Amato wrote: Pt has moved out of state and [facility] [address] [place]  [place] [address]  phone [phone]   Best number [phone]

## 2017-10-19 NOTE — Telephone Encounter (Signed)
Pt of Dr. Para March that has moved to [place].   She [facility] [place] [address] [place], [place]  [address] [phone]  Pt CB# [phone]

## 2017-12-11 IMAGING — MR MR HEAD W/O CM
11 series · 48 of 48 positions shown · non-contrast
Comparison: None.

CLINICAL DATA: Tremor headache

EXAM:
MRI HEAD WITHOUT CONTRAST
TECHNIQUE: Multiplanar, multiecho pulse sequences of the brain and surrounding
structures were obtained without intravenous contrast.

[Series 4: DWI · axial · 3.0mm · 1.80mm/px · z∈[-62,+97]mm · 4 of 55 slices shown (1 of 2)]
[im 1/55]
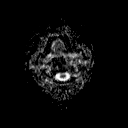
[im 19/55]
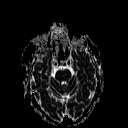
[im 37/55]
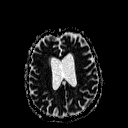
[im 55/55]
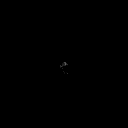

[Series 6: DWI · coronal · 3.0mm · 1.80mm/px · 4 of 45 slices shown (2 of 2)]
[im 1/45]
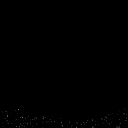
[im 15/45]
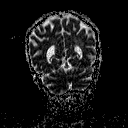
[im 30/45]
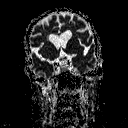
[im 45/45]
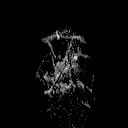

[Series 7: T2 · axial · 5.0mm · 0.60mm/px · z∈[-57,+96]mm · 2 of 25 slices shown (1 of 4)]
[im 1/25]
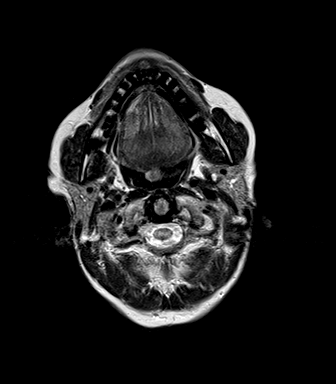
[im 25/25]
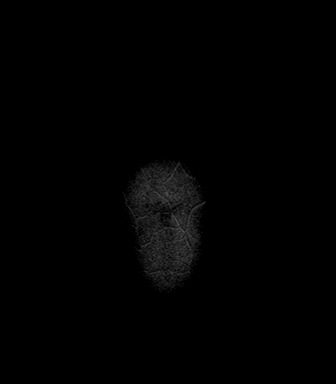

[Series 8: FLAIR · axial · 3.0mm · 0.45mm/px · z∈[-57,+96]mm · 5 of 53 slices shown]
[im 1/53]
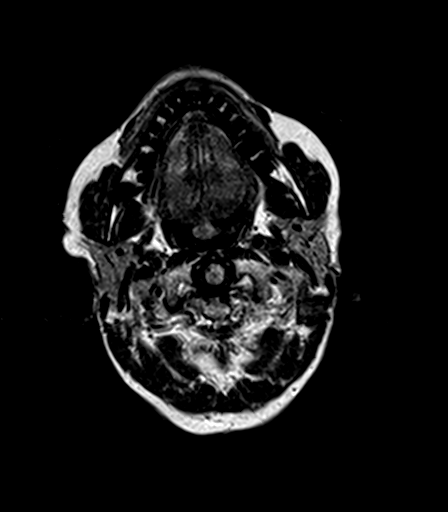
[im 14/53]
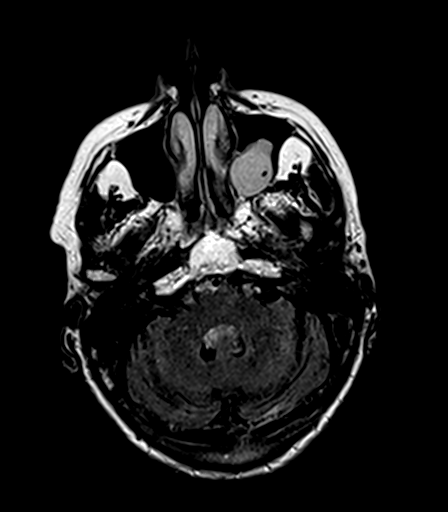
[im 27/53]
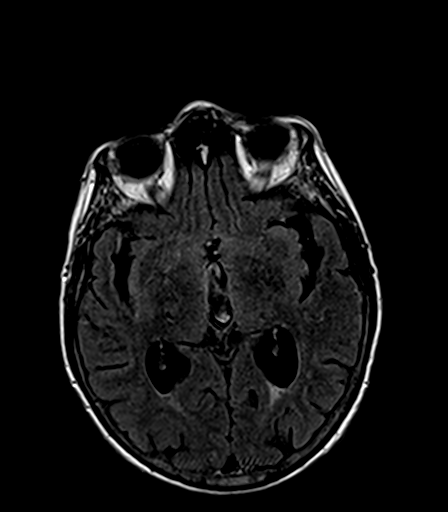
[im 40/53]
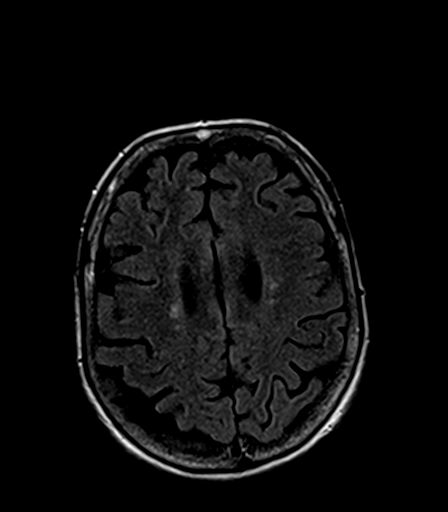
[im 53/53]
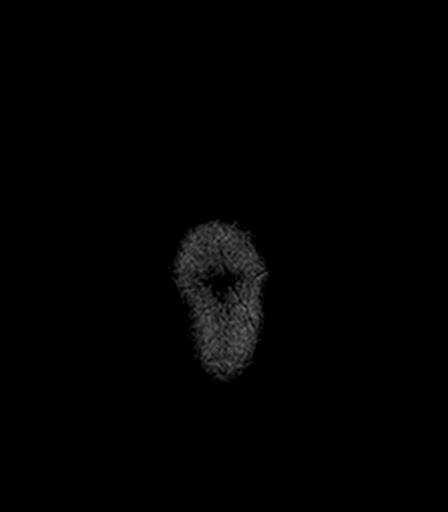

[Series 9: T2 · axial · 5.0mm · 0.45mm/px · z∈[-57,+96]mm · 2 of 25 slices shown (2 of 4)]
[im 1/25]
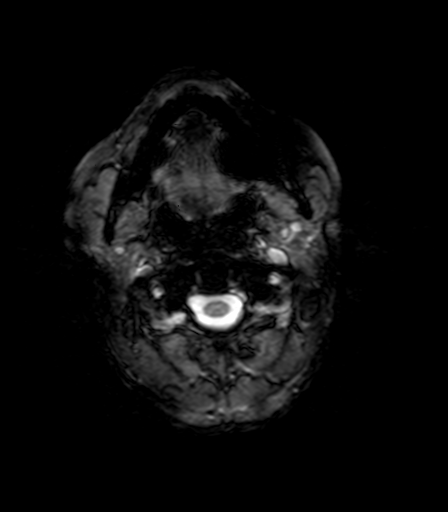
[im 25/25]
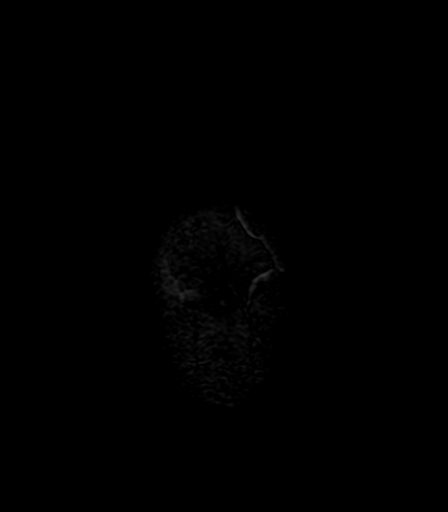

[Series 10: T1 · sagittal · 5.0mm · 0.45mm/px · 2 of 23 slices shown (1 of 2)]
[im 1/23]
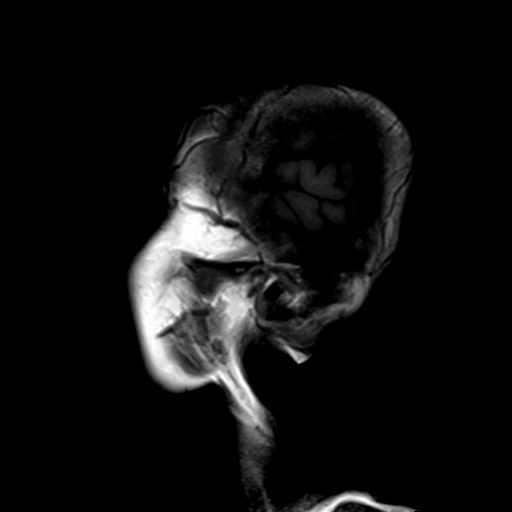
[im 23/23]
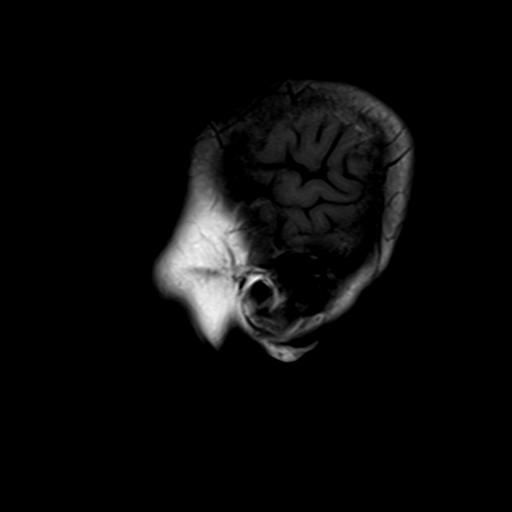

[Series 11: T1 · axial · 1.0mm · 1.00mm/px · z∈[-65,+107]mm · 15 of 176 slices shown (2 of 2)]
[im 1/176]
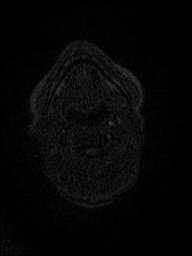
[im 13/176]
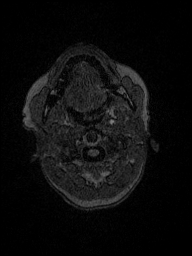
[im 26/176]
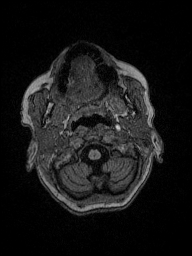
[im 38/176]
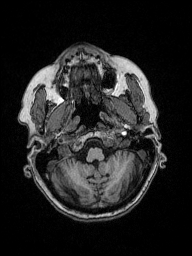
[im 51/176]
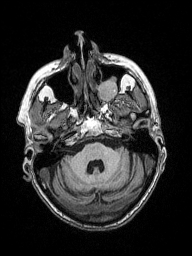
[im 63/176]
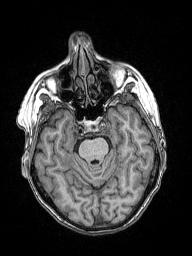
[im 76/176]
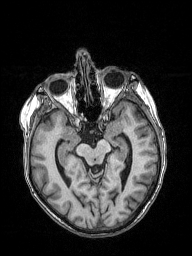
[im 88/176]
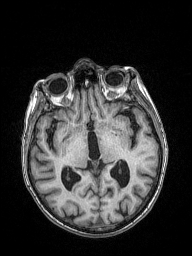
[im 101/176]
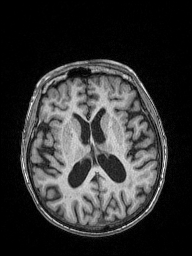
[im 113/176]
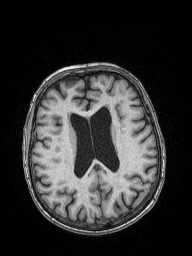
[im 126/176]
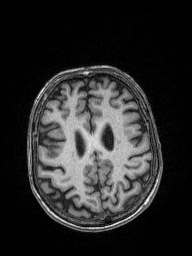
[im 138/176]
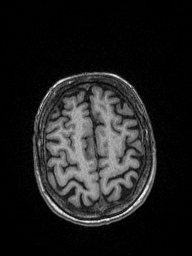
[im 151/176]
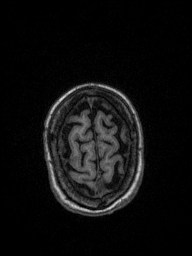
[im 163/176]
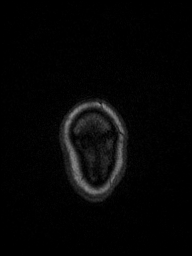
[im 176/176]
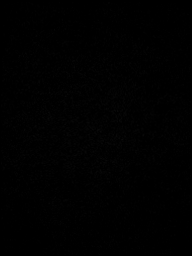

[Series 12: T2 · coronal · 5.0mm · 0.49mm/px · 2 of 27 slices shown (3 of 4)]
[im 1/27]
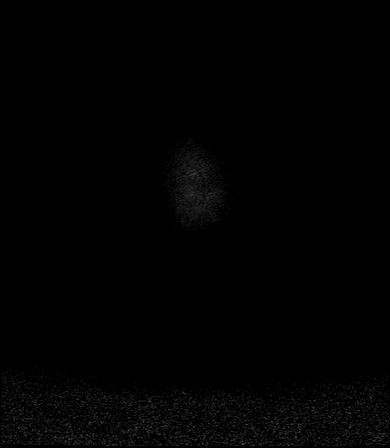
[im 27/27]
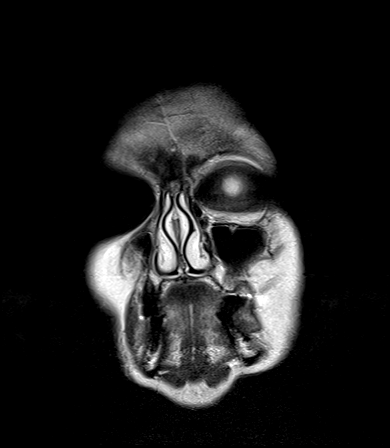

[Series 13: T2 · coronal · 4.0mm · 0.60mm/px · 3 of 31 slices shown (4 of 4)]
[im 1/31]
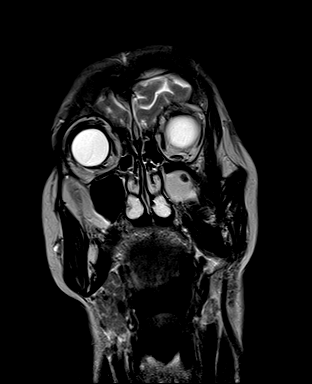
[im 16/31]
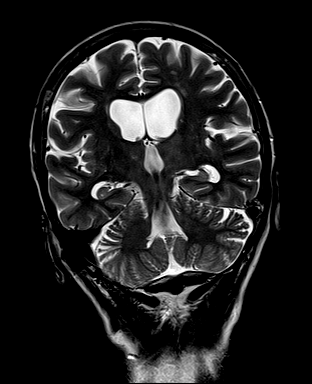
[im 31/31]
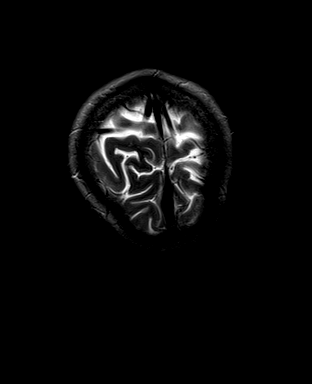

[Series 100: (id) · axial · 3.0mm · 1.80mm/px · z∈[-62,+97]mm · 5 of 55 slices shown]
[im 1/55]
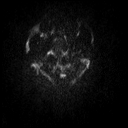
[im 14/55]
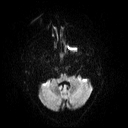
[im 28/55]
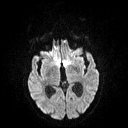
[im 41/55]
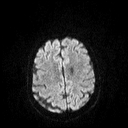
[im 55/55]
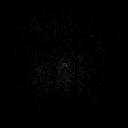

[Series 102: cor (id) · coronal · 3.0mm · 1.80mm/px · 4 of 43 slices shown]
[im 1/43]
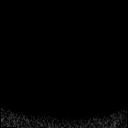
[im 15/43]
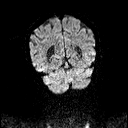
[im 29/43]
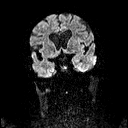
[im 43/43]
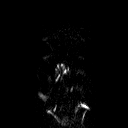

[48 of 48 positions shown; findings below may reference images not displayed]

FINDINGS: Brain: Cerebral volume normal. Negative for hydrocephalus. Negative
for acute infarct. Mild chronic appearing changes in the white
matter bilaterally consistent with microvascular ischemia. Negative
for hemorrhage or mass or fluid collection

Vascular: Normal arterial flow voids

Skull and upper cervical spine: Negative

Sinuses/Orbits: Mucous retention cyst left maxillary sinus.
Bilateral cataract removal

Other: None
IMPRESSION: No acute abnormality.  Mild chronic microvascular ischemia.

## 2018-06-04 ENCOUNTER — Ambulatory Visit: Payer: PRIVATE HEALTH INSURANCE

## 2018-06-04 ENCOUNTER — Encounter: Payer: PRIVATE HEALTH INSURANCE | Admitting: Family Medicine

## 2020-10-15 DEATH — deceased
